# Patient Record
Sex: Female | Born: 1960 | Race: White | Hispanic: No | Marital: Married | Smoking: Former smoker
Health system: Southern US, Community
[De-identification: ages and names within clinical notes are randomized; demographics above are authoritative.]

## PROBLEM LIST (undated history)

## (undated) DIAGNOSIS — G5600 Carpal tunnel syndrome, unspecified upper limb: Secondary | ICD-10-CM

## (undated) DIAGNOSIS — M797 Fibromyalgia: Secondary | ICD-10-CM

## (undated) DIAGNOSIS — I719 Aortic aneurysm of unspecified site, without rupture: Secondary | ICD-10-CM

## (undated) DIAGNOSIS — I82409 Acute embolism and thrombosis of unspecified deep veins of unspecified lower extremity: Secondary | ICD-10-CM

## (undated) DIAGNOSIS — M2550 Pain in unspecified joint: Secondary | ICD-10-CM

## (undated) DIAGNOSIS — I10 Essential (primary) hypertension: Secondary | ICD-10-CM

## (undated) DIAGNOSIS — R002 Palpitations: Secondary | ICD-10-CM

## (undated) HISTORY — DX: Carpal tunnel syndrome, unspecified upper limb: G56.00

## (undated) HISTORY — DX: Pain in unspecified joint: M25.50

## (undated) HISTORY — DX: Palpitations: R00.2

## (undated) HISTORY — DX: Acute embolism and thrombosis of unspecified deep veins of unspecified lower extremity: I82.409

## (undated) HISTORY — DX: Fibromyalgia: M79.7

## (undated) HISTORY — DX: Essential (primary) hypertension: I10

---

## 2013-05-17 ENCOUNTER — Ambulatory Visit (INDEPENDENT_AMBULATORY_CARE_PROVIDER_SITE_OTHER): Payer: BC Managed Care – PPO | Admitting: Neurology

## 2013-05-17 ENCOUNTER — Ambulatory Visit (INDEPENDENT_AMBULATORY_CARE_PROVIDER_SITE_OTHER): Payer: Self-pay | Admitting: Radiology

## 2013-05-17 DIAGNOSIS — G5600 Carpal tunnel syndrome, unspecified upper limb: Secondary | ICD-10-CM

## 2013-05-17 DIAGNOSIS — G5602 Carpal tunnel syndrome, left upper limb: Secondary | ICD-10-CM

## 2013-05-17 DIAGNOSIS — Z0289 Encounter for other administrative examinations: Secondary | ICD-10-CM

## 2013-05-17 NOTE — Procedures (Signed)
     HISTORY:  Carla RiegerJill Lehenbauer is a 53 year old patient with a history of onset of numbness and discomfort into the left hand that began in November 2014. The patient has some discomfort into the left elbow is well, and she does have some neck discomfort. The patient denies pain radiating from the neck down arm. The patient is being evaluated for possible neuropathy or a cervical radiculopathy.  NERVE CONDUCTION STUDIES:  Nerve conduction studies were performed on the left upper extremity. The distal motor latency for the left median nerve was prolonged, with a normal motor amplitude. The distal motor latency and motor activity for the left ulnar nerve was normal. The F wave latencies and nerve conduction velocities for the left median and ulnar nerves were normal. The sensory latency for the left median nerve was prolonged, normal for the left ulnar nerve and left radial nerve.   EMG STUDIES:  EMG study was performed on the left upper extremity:  The first dorsal interosseous muscle reveals 2 to 4 K units with full recruitment. No fibrillations or positive waves were noted. The abductor pollicis brevis muscle reveals 2 to 4 K units with full recruitment. No fibrillations or positive waves were noted. The extensor indicis proprius muscle reveals 1 to 3 K units with full recruitment. No fibrillations or positive waves were noted. The pronator teres muscle reveals 2 to 3 K units with full recruitment. No fibrillations or positive waves were noted. The biceps muscle reveals 1 to 2 K units with full recruitment. No fibrillations or positive waves were noted. The triceps muscle reveals 2 to 5 K units with full recruitment. No fibrillations or positive waves were noted. The anterior deltoid muscle reveals 2 to 3 K units with full recruitment. No fibrillations or positive waves were noted. The cervical paraspinal muscles were tested at 2 levels. No abnormalities of insertional activity were seen at  either level tested. There was good relaxation.   IMPRESSION:  Nerve conduction studies performed on the left upper extremity shows evidence of a mild left carpal tunnel syndrome. EMG evaluation of the left upper extremity was relatively unremarkable, without clear evidence of an overlying cervical radiculopathy.  Marlan Palau. Keith Willis MD 05/17/2013 11:02 AM  [facility] [address] [place], [place] [id number]  Phone [phone] Fax [phone]

## 2013-06-17 ENCOUNTER — Encounter: Payer: Self-pay | Admitting: *Deleted

## 2013-06-17 ENCOUNTER — Ambulatory Visit (INDEPENDENT_AMBULATORY_CARE_PROVIDER_SITE_OTHER): Payer: BC Managed Care – PPO | Admitting: Cardiovascular Disease

## 2013-06-17 ENCOUNTER — Other Ambulatory Visit: Payer: Self-pay | Admitting: *Deleted

## 2013-06-17 ENCOUNTER — Telehealth: Payer: Self-pay | Admitting: *Deleted

## 2013-06-17 VITALS — BP 142/82 | HR 76 | Ht 63.5 in | Wt 125.0 lb

## 2013-06-17 DIAGNOSIS — I10 Essential (primary) hypertension: Secondary | ICD-10-CM

## 2013-06-17 DIAGNOSIS — R002 Palpitations: Secondary | ICD-10-CM

## 2013-06-17 DIAGNOSIS — I82409 Acute embolism and thrombosis of unspecified deep veins of unspecified lower extremity: Secondary | ICD-10-CM | POA: Insufficient documentation

## 2013-06-17 DIAGNOSIS — M797 Fibromyalgia: Secondary | ICD-10-CM | POA: Insufficient documentation

## 2013-06-17 DIAGNOSIS — M2550 Pain in unspecified joint: Secondary | ICD-10-CM | POA: Insufficient documentation

## 2013-06-17 DIAGNOSIS — G5600 Carpal tunnel syndrome, unspecified upper limb: Secondary | ICD-10-CM | POA: Insufficient documentation

## 2013-06-17 NOTE — Patient Instructions (Signed)
Your physician recommends that you schedule a follow-up appointment in: AFTER TESTS ARE  DONE  WITH  [facility] Your physician recommends that you continue on your current medications as directed. Please refer to the Current Medication list given to you today. Your physician recommends that you return for lab work in: TODAY     24 HOUR  URINE    RANDOM  CORTISOL Your physician has requested that you have a stress echocardiogram. For further information please visit [email or web address]. Please follow instruction sheet as given. Your physician has requested that you have a renal artery duplex. During this test, an ultrasound is used to evaluate blood flow to the kidneys. Allow one hour for this exam. Do not eat after midnight the day before and avoid carbonated beverages. Take your medications as you usually do.

## 2013-06-17 NOTE — Assessment & Plan Note (Signed)
Resolved In general gastrocnemious clots do not represent DVT and do not need anticoagulation  No pain or swelling

## 2013-06-17 NOTE — Assessment & Plan Note (Signed)
Chest pain and palpitations with SEM f/u stress echo

## 2013-06-17 NOTE — Telephone Encounter (Signed)
LEFT  MESSAGE FOR  PT  TO CALL BACK  RE  DR  Eden EmmsNISHAN  THOUGHT   PT  WAS  TAKING   A  THIRD  MED  (ARB ) .Zack Seal/CY

## 2013-06-17 NOTE — Assessment & Plan Note (Signed)
Med list needs to be updated Continue ARB and beta blocker.  Will check cortisol, 24 hour catacholemines/metanephrines, renal US to r/o RAS  Already know K and Cr are normal More likely essential hypertension

## 2013-06-17 NOTE — Progress Notes (Signed)
Patient ID: Carla RiegerJill [name], female   DOB: 09/25/1960, 53 y.o.   MRN: [id number]  53 yo referred by primary for palpitations and HTN.  Patient concerned about secondary causes of HTN  Also having some discomfort in left hand  Recent EMG by Dr Carla [name] suggests mild carpal tunnel  She moved from [place] 6 months ago.  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  Has had issues with BP over last 3 years. Cough with ACE On escalating doses of ARB.  Recent tightness in chest and palpitations while running.  No syncope.  She is active does yoga , no excess salt and no excess ETOH   She does have some anxiety over BP and the move to [place] has been stressful.  Has an 54 yo daughter as well.  No history of chronic kidney disease  No heart disease [facility] Cr and K.  She did have a left gastrocnemous DVT Rx for 3 months with xarelto  This was at a time she was flying and traveling a lot for work      ROS: Denies fever, malais, weight loss, blurry vision, decreased visual acuity, cough, sputum, SOB, hemoptysis, pleuritic pain, palpitaitons, heartburn, abdominal pain, melena, lower extremity edema, claudication, or rash.  All other systems reviewed and negative   General: Affect appropriate Healthy:  appears stated age HEENT: normal Neck supple with no adenopathy JVP normal no bruits no thyromegaly Lungs clear with no wheezing and good diaphragmatic motion Heart:  S1/S2 1/6 SEM  murmur,rub, gallop or click PMI normal Abdomen: benighn, BS positve, no tenderness, no AAA no bruit.  No HSM or HJR Distal pulses intact with no bruits No edema Neuro non-focal Skin warm and dry No muscular weakness  Medications No current outpatient prescriptions on file.   No current facility-administered medications for this visit.    Allergies Review of patient's allergies indicates not on file.  Family History: Family History  Problem Relation Age of Onset  . Aneurysm Mother     brain    Social  History: History   Social History  . Marital Status: Unknown    Spouse Name: N/A    Number of Children: N/A  . Years of Education: N/A   Occupational History  . Not on file.   Social History Main Topics  . Smoking status: Former Games developermoker  . Smokeless tobacco: Not on file     Comment: quit 2003-2004  . Alcohol Use: Yes  . Drug Use: No  . Sexual Activity: Not on file   Other Topics Concern  . Not on file   Social History Narrative  . No narrative on file    Electrocardiogram: SR rate 61 normal ECG no LVH   Assessment and Plan

## 2013-06-18 LAB — CORTISOL: CORTISOL PLASMA: 11.8 ug/dL

## 2013-06-18 NOTE — Telephone Encounter (Signed)
New Prob    Pt has some information to provide for her chart. Please call.

## 2013-06-18 NOTE — Telephone Encounter (Signed)
Pt asks for result of blood work done ysterday.

## 2013-06-18 NOTE — Telephone Encounter (Signed)
Cortisol level is normal

## 2013-06-18 NOTE — Telephone Encounter (Signed)
Patient called to say she is taking benicar 40 mg daily.  Added this to her medication list.  She would like results of her testing yesterday to be released to My Chart when available.

## 2013-06-18 NOTE — Telephone Encounter (Signed)
Follow UP:  Pt is calling for test results.

## 2013-06-18 NOTE — Telephone Encounter (Signed)
Pt made aware cortisol level is normal.

## 2013-06-21 ENCOUNTER — Other Ambulatory Visit: Payer: BC Managed Care – PPO

## 2013-06-21 DIAGNOSIS — R002 Palpitations: Secondary | ICD-10-CM

## 2013-06-21 DIAGNOSIS — I10 Essential (primary) hypertension: Secondary | ICD-10-CM

## 2013-06-25 ENCOUNTER — Ambulatory Visit (HOSPITAL_COMMUNITY): Payer: BC Managed Care – PPO | Attending: Cardiovascular Disease

## 2013-06-25 DIAGNOSIS — I10 Essential (primary) hypertension: Secondary | ICD-10-CM | POA: Insufficient documentation

## 2013-06-25 LAB — CATECHOLAMINES, FRACTIONATED, URINE, 24 HOUR
Calculated Total (E+NE): 58 mcg/24 h (ref 26–121)
Creatinine, Urine mg/day-CATEUR: 0.97 g/(24.h) (ref 0.63–2.50)
Dopamine, 24 hr Urine: 200 mcg/24 h (ref 52–480)
EPINEPHRINE, 24 HR URINE: 8 ug/(24.h) (ref 2–24)
Norepinephrine, 24 hr Ur: 50 mcg/24 h (ref 15–100)
TOTAL VOLUME - CF 24HR U: 1500 mL

## 2013-06-26 ENCOUNTER — Encounter: Payer: Self-pay | Admitting: Cardiovascular Disease

## 2013-06-26 LAB — METANEPHRINES, URINE, 24 HOUR
Metaneph Total, Ur: 316 mcg/24 h (ref 224–832)
Metanephrines, Ur: 78 mcg/24 h — ABNORMAL LOW (ref 90–315)
Normetanephrine, 24H Ur: 238 mcg/24 h (ref 122–676)

## 2013-06-27 NOTE — Telephone Encounter (Signed)
Carla [name] called because she was confused about her lab results. She is aware that the lab results are normal but was concerned because of the information listed below the results which stated that results 4 times normal were most likely due to a tumor. She was very concerned about this.  Spent greater than 30 minutes in conversation with her discussing the results and reassuring her that the tumor was only likely if results were 4 times normal. Since her results were normal, I did not feel a tumor was likely.  She felt this information and should not be included in the results because it might confuse people

## 2013-07-15 ENCOUNTER — Encounter: Payer: Self-pay | Admitting: Cardiovascular Disease

## 2013-07-15 ENCOUNTER — Ambulatory Visit (HOSPITAL_COMMUNITY): Payer: BC Managed Care – PPO | Attending: Cardiovascular Disease | Admitting: Radiology

## 2013-07-15 DIAGNOSIS — I10 Essential (primary) hypertension: Secondary | ICD-10-CM | POA: Insufficient documentation

## 2013-07-15 DIAGNOSIS — R072 Precordial pain: Secondary | ICD-10-CM

## 2013-07-15 DIAGNOSIS — R002 Palpitations: Secondary | ICD-10-CM | POA: Insufficient documentation

## 2013-07-15 NOTE — Progress Notes (Signed)
Stress Echocardiogram performed.  

## 2013-07-16 ENCOUNTER — Ambulatory Visit (INDEPENDENT_AMBULATORY_CARE_PROVIDER_SITE_OTHER): Payer: BC Managed Care – PPO | Admitting: Cardiovascular Disease

## 2013-07-16 ENCOUNTER — Encounter: Payer: Self-pay | Admitting: Cardiovascular Disease

## 2013-07-16 VITALS — BP 156/86 | HR 50 | Ht 63.5 in | Wt 129.0 lb

## 2013-07-16 DIAGNOSIS — I10 Essential (primary) hypertension: Secondary | ICD-10-CM

## 2013-07-16 MED ORDER — LISINOPRIL-HYDROCHLOROTHIAZIDE 20-25 MG PO TABS
1.0000 | ORAL_TABLET | Freq: Every day | ORAL | Status: DC
Start: 2013-07-16 — End: 2013-07-19

## 2013-07-16 MED ORDER — NEBIVOLOL HCL 2.5 MG PO TABS: 2.5000 mg | ORAL_TABLET | Freq: Every day | ORAL | Status: DC

## 2013-07-16 NOTE — Assessment & Plan Note (Signed)
Will call in generic lisinopril/HCTZ 20/25 mg  She indicates wanting to stop beta blocker.  She will restart her beta blocker if palpitatoins get worse

## 2013-07-16 NOTE — Patient Instructions (Signed)
Your physician recommends that you schedule a follow-up appointment in: AS NEEDED Your physician recommends that you continue on your current medications as directed. Please refer to the Current Medication list given to you today. LISINOPRIL /HCTZ  20/25 MG  EVERY DAY

## 2013-07-16 NOTE — Progress Notes (Signed)
Patient ID: Carla RiegerJill Salm, female   DOB: [birth year], 53 y.o.   MRN: [id number] 53 yo referred by primary for palpitations and HTN. Patient concerned about secondary causes of HTN Also having some discomfort in left hand Recent EMG by Dr Hanah HahnWillis suggests mild carpal tunnel She moved from [place] 6 months ago. 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 Cough with ACE  On escalating doses of ARB. Recent tightness in chest and palpitations while running. No syncope. She is active does yoga , no excess salt and no excess ETOH  She does have some anxiety over BP and the move to [place] has been stressful. Has an 54 yo daughter as well. No history of chronic kidney disease No heart disease  [facility] normal Cr and K. She did have a left gastrocnemous DVT Rx for 3 months with xarelto This was at a time she was flying and traveling a lot for work    F/U stress echo normal F/U renal duplex normal  F/U corisol, metanephrines and K/Cr all normal as well   Continues to be somewhat anxious about her fluctuating BP  Was supposed to be on benicar but too expensive.  Beta blockers have helped her palpitations But does not want to be on 3 medications   ROS: Denies fever, malais, weight loss, blurry vision, decreased visual acuity, cough, sputum, SOB, hemoptysis, pleuritic pain, palpitaitons, heartburn, abdominal pain, melena, lower extremity edema, claudication, or rash.  All other systems reviewed and negative  General: Affect appropriate Healthy:  appears stated age HEENT: normal Neck supple with no adenopathy JVP normal no bruits no thyromegaly Lungs clear with no wheezing and good diaphragmatic motion Heart:  S1/S2 no murmur, no rub, gallop or click PMI normal Abdomen: benighn, BS positve, no tenderness, no AAA no bruit.  No HSM or HJR Distal pulses intact with no bruits No edema Neuro non-focal Skin warm and dry No muscular weakness   Current Outpatient  Prescriptions  Medication Sig Dispense Refill  . clonazePAM (KLONOPIN) 0.5 MG tablet as needed.      . nebivolol (BYSTOLIC) 5 MG tablet Take 2.5 mg by mouth daily.      Marland Kitchen. olmesartan (BENICAR) 40 MG tablet Take 40 mg by mouth daily.       No current facility-administered medications for this visit.    Allergies  Review of patient's allergies indicates no known allergies.  Electrocardiogram:  Assessment and Plan

## 2013-07-19 ENCOUNTER — Other Ambulatory Visit: Payer: Self-pay | Admitting: *Deleted

## 2013-07-19 ENCOUNTER — Encounter: Payer: Self-pay | Admitting: Cardiovascular Disease

## 2013-07-19 MED ORDER — LOSARTAN POTASSIUM-HCTZ 100-25 MG PO TABS: 1.0000 | ORAL_TABLET | Freq: Every day | ORAL | Status: DC

## 2013-08-16 ENCOUNTER — Other Ambulatory Visit: Payer: Self-pay | Admitting: *Deleted

## 2013-08-16 ENCOUNTER — Encounter: Payer: Self-pay | Admitting: Cardiovascular Disease

## 2013-08-16 MED ORDER — ATENOLOL 25 MG PO TABS: 25.0000 mg | ORAL_TABLET | Freq: Every day | ORAL | Status: DC

## 2013-08-27 NOTE — Telephone Encounter (Signed)
CALLED  PT RE  CHANGING  MED  PT  REFUSES   TO DO  AT THIS TIME  HAS STOPPED  BYSTOLIC./CY

## 2013-10-22 ENCOUNTER — Other Ambulatory Visit: Payer: Self-pay | Admitting: Obstetrics and Gynecology

## 2013-10-22 DIAGNOSIS — Z1231 Encounter for screening mammogram for malignant neoplasm of breast: Secondary | ICD-10-CM

## 2013-11-30 ENCOUNTER — Ambulatory Visit: Payer: BC Managed Care – PPO

## 2013-12-07 ENCOUNTER — Ambulatory Visit: Payer: BC Managed Care – PPO

## 2013-12-08 ENCOUNTER — Ambulatory Visit
Admission: RE | Admit: 2013-12-08 | Discharge: 2013-12-08 | Disposition: A | Discharge status: Home / Self Care | Payer: BC Managed Care – PPO | Source: Ambulatory Visit | Attending: Obstetrics and Gynecology | Admitting: Obstetrics and Gynecology

## 2013-12-08 DIAGNOSIS — Z1231 Encounter for screening mammogram for malignant neoplasm of breast: Secondary | ICD-10-CM

## 2014-03-08 ENCOUNTER — Other Ambulatory Visit: Payer: Self-pay | Admitting: Gastroenterology

## 2014-03-08 DIAGNOSIS — R109 Unspecified abdominal pain: Secondary | ICD-10-CM

## 2014-03-14 ENCOUNTER — Ambulatory Visit
Admission: RE | Admit: 2014-03-14 | Discharge: 2014-03-14 | Disposition: A | Discharge status: Home / Self Care | Payer: BC Managed Care – PPO | Source: Ambulatory Visit | Attending: Gastroenterology | Admitting: Gastroenterology

## 2014-03-14 DIAGNOSIS — R109 Unspecified abdominal pain: Secondary | ICD-10-CM

## 2014-08-12 ENCOUNTER — Other Ambulatory Visit: Payer: Self-pay | Admitting: Family Medicine

## 2014-08-24 ENCOUNTER — Other Ambulatory Visit: Payer: Self-pay | Admitting: Family Medicine

## 2014-08-25 ENCOUNTER — Other Ambulatory Visit: Payer: Self-pay | Admitting: Family Medicine

## 2014-08-25 DIAGNOSIS — Z87891 Personal history of nicotine dependence: Secondary | ICD-10-CM

## 2014-08-31 ENCOUNTER — Other Ambulatory Visit: Payer: Self-pay | Admitting: Family Medicine

## 2014-08-31 ENCOUNTER — Other Ambulatory Visit: Payer: Self-pay

## 2014-09-01 ENCOUNTER — Ambulatory Visit
Admission: RE | Admit: 2014-09-01 | Discharge: 2014-09-01 | Disposition: A | Discharge status: Home / Self Care | Payer: Self-pay | Source: Ambulatory Visit | Attending: Family Medicine | Admitting: Family Medicine

## 2014-09-01 DIAGNOSIS — Z87891 Personal history of nicotine dependence: Secondary | ICD-10-CM

## 2014-09-05 ENCOUNTER — Other Ambulatory Visit: Payer: Self-pay | Admitting: Cardiology

## 2014-09-05 DIAGNOSIS — I10 Essential (primary) hypertension: Secondary | ICD-10-CM

## 2014-09-06 ENCOUNTER — Other Ambulatory Visit: Payer: Self-pay | Admitting: Cardiology

## 2014-09-06 ENCOUNTER — Ambulatory Visit
Admission: RE | Admit: 2014-09-06 | Discharge: 2014-09-06 | Disposition: A | Discharge status: Home / Self Care | Payer: Self-pay | Source: Ambulatory Visit | Attending: Cardiology | Admitting: Cardiology

## 2014-09-06 DIAGNOSIS — R072 Precordial pain: Secondary | ICD-10-CM

## 2014-09-06 DIAGNOSIS — I10 Essential (primary) hypertension: Secondary | ICD-10-CM

## 2014-09-06 NOTE — Progress Notes (Signed)
Carla [name], [name]  Date of visit:  09/05/2014 DOB:  1960-09-01    Age:  53 yrs. Medical record number:  [id number]     Account number:  [id number] Primary Care Provider: [facility] ____________________________ CURRENT DIAGNOSES  1. Essential hypertension  2. Ventricular premature depolarization  3. CAD Native without angina  4. Atrial premature depolarization  5. Ascending aortic aneurysm  6. Dyspnea ____________________________ ALLERGIES  Ace Inhibitors, Intolerance-cough  Bystolic, Headache ____________________________ MEDICATIONS  1. hydrochlorothiazide 25 mg tablet, 1 p.o. daily  2. Benicar 20 mg tablet, 1 p.o. daily  3. metoprolol succinate ER 25 mg tablet,extended release 24 hr, 1 p.o. daily  4. atorvastatin 10 mg tablet, 1 p.o. daily ____________________________ CHIEF COMPLAINTS  talk re ct results ____________________________ HISTORY OF PRESENT ILLNESS Patient returns early for cardiac followup. She evidently had a CT scan done at the request of her primary doctor and asked me to review the results. She was found to have a dilated ascending aorta as well as some degree of coronary calcification involving the right coronary artery and LAD and she wished to discuss this further. She previously had an echocardiogram that showed mild LVH and in addition her treadmill was negative into Bruce stage V. She continues to complain of inability to hold out doing very long runs blood has been able to be involved in normal physical activity. In the past she has worn an  event monitor that has shown that her heart rate is up after she runs long distances but nothing pathological. No definite angina that I can tell. She had her cholesterol checked earlier today. She does have a family history of aortic aneurysm with both her mother and her brother having had surgical repair. I did review the actual CT images as well as the report. ____________________________ PAST HISTORY  Past Medical  Illnesses:  hypertension, DVT of gastocnemius veins 2014, allergic rhiniits, carpal tunnel syndrome;  Cardiovascular Illnesses:  arrhythmia-PVCs;  Surgical Procedures:  appendectomy, cholecystectomy, tonsillectomy;  NYHA Classification:  I;  Canadian Angina Classification:  Class 0: Asymptomatic;  Cardiology Procedures-Invasive:  no history of prior cardiac procedures;  Cardiology Procedures-Noninvasive:  stress echocardiogram March 2015, event monitor May 2015, event monitor March 2016;  Peripheral Vascular Procedures:  renal doppler;  LVEF of 60% documented via echocardiogram on 07/18/2013,   ____________________________ CARDIO-PULMONARY TEST DATES EKG Date:  08/04/2014;  Holter/Event Monitor Date: 08/05/2014;  Echocardiography Date: 07/18/2013;   ____________________________ FAMILY HISTORY Brother -- Brother alive and well Brother -- Brother alive with problem, Aortic aneurysm Father -- Father dead, Malignant neoplasm of lung Mother -- Mother dead, Cerebral arterial aneurysm Sister -- Sister alive and well Sister -- Sister alive and well ____________________________ SOCIAL HISTORY Alcohol Use:  6-8 glasses wine weekly;  Smoking:  used to smoke but quit, 15 pack year history;  Diet:  vegetarian;  Lifestyle:  married;  Occupation:  [employer];  Residence:  lives with husband and children;   ____________________________ REVIEW OF SYSTEMS General:  denies recent weight change, fatique or change in exercise tolerance. Eyes: wears eye glasses/contact lenses Respiratory: denies dyspnea, cough, wheezing or hemoptysis. Cardiovascular:  please review HPI Abdominal: denies dyspepsia, GI bleeding, constipation, or diarrheaGenitourinary-Female: frequency, urgency Musculoskeletal:  cervical disc disease, arthritis of the neck Neurological:  headaches Psychiatric:  situational stress, insomnia  ____________________________ PHYSICAL EXAMINATION VITAL SIGNS  Blood Pressure:  120/70 Sitting, Left arm,  regular cuff  , 116/70 Standing, Left arm and regular cuff   Pulse:  94/min. Weight:  131.00 lbs. Height:  63"BMI: 23  Constitutional:  pleasant white female, in no acute distress, anxious Skin:  warm and dry to touch, no apparent skin lesions, or masses noted. Head:  normocephalic, normal hair pattern, no masses or tenderness Chest:  normal symmetry, clear to auscultation. Cardiac:  regular rhythm, normal S1 and S2, No S3 or S4, no murmurs, gallops or rubs detected. Neurological:  no gross motor or sensory deficits noted, affect appropriate, oriented x3. ____________________________ IMPRESSIONS/PLAN 1. Mild thoracic aortic aneurysm 2. Coronary calcification is noted on CT scan without obvious symptoms and negative treadmill into Bruce stage V 3. Essential hypertension  Recommendations:  1. Discussed management of thoracic aneurysms. At her current size she will need yearly surveillance with a CT scan. We discussed limitation of severe weight lifting and also recommended that she initiate beta blocker therapy with metoprolol XL 25 mg daily. She does have a family history so we will need to watch this 2. Coronary calcification as a manifestation of coronary artery disease was also discussed with the patient. Her lipid status is pending. In light of her aneurysm as well as her coronary calcification of recommended statin therapy her regardless of her values. She would best benefit from high-intensity statin therapy but she is somewhat hesitant to take statins and we discussed this in detail. She was willing to initiate atorvastatin 10 mg every other day 3. Hypertension 4. Episodic palpitations that we've not been able to identify a cause for on monitoring.  She was very interested known how much calcium she has and I recommended a coronary calcium score to quantitate with  followup afterwards. Thank you for asking me to see her with you.  SHe later called back and wished to have a coronary CTA  performed.  ____________________________ TODAYS ORDERS  1. Cardiac calcium score or coronary CTA  2. Return Visit: 3 months                       ____________________________ Cardiology Physician:  Darden Palmer MD [facility]

## 2014-09-12 ENCOUNTER — Other Ambulatory Visit: Payer: Self-pay | Admitting: Cardiology

## 2014-09-12 ENCOUNTER — Encounter: Payer: Self-pay | Admitting: Cardiology

## 2014-09-12 DIAGNOSIS — E785 Hyperlipidemia, unspecified: Secondary | ICD-10-CM

## 2014-09-12 DIAGNOSIS — I2510 Atherosclerotic heart disease of native coronary artery without angina pectoris: Secondary | ICD-10-CM

## 2014-09-12 DIAGNOSIS — E7849 Other hyperlipidemia: Secondary | ICD-10-CM | POA: Insufficient documentation

## 2014-09-12 NOTE — Progress Notes (Unsigned)
Carla [name], [name]  Date of visit:  09/12/2014 DOB:  10-25-1960    Age:  53 yrs. Medical record number:  [id number]     Account number:  [id number] Primary Care Provider: MCNEILL, [facility] ____________________________ CURRENT DIAGNOSES  1. CAD Native without angina  2. Ventricular premature depolarization  3. Encounter for preprocedural cardiovascular examination  4. Essential hypertension  5. Ascending aortic aneurysm  6. Dyspnea  7. Atrial premature depolarization ____________________________ ALLERGIES  Ace Inhibitors, Intolerance-cough  Bystolic, Headache ____________________________ MEDICATIONS  1. hydrochlorothiazide 25 mg tablet, 1 p.o. daily  2. Benicar 20 mg tablet, 1 p.o. daily  3. atorvastatin 40 mg tablet, 1 p.o. daily ____________________________ CHIEF COMPLAINTS  to discuss cath ____________________________ HISTORY OF PRESENT ILLNESS  This [age] year old female is seen for a precatheterization cardiac evaluation. The patient has a history of hypertension for the past 4 years and was previously treated in [place] the with an ACE inhibitor then eventually caused a cough. She was later switched to olmesartan then helped her blood pressure and also helped reduce her PVCs. 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 Her blood pressure has been elevated since she moved and she has had some situational stress related to relocation as well as dealing with her 55 year old son who still is in [place] and has difficulty with substance abuse. She began that note uncontrolled high blood pressure and also developed frequent PVCs at rest and while running. She was placed on diastolic and later was seen by Dr. Charlton HawsPeter Nishan and had a renal Doppler that showed no evidence of renal artery stenosis, a stress echocardiogram that was normal and -24 hour urine for catecholamines and a normal cortisol. She discontinued taking Bystolic because of a  headache and has been taking losartan/HCTZ for blood pressure.She had a DVT of her gastrocnemius muscle treated with Xarelto for 3 months and evidently had a negative followup Doppler study. At the time I saw her one year ago she had blood pressure that was not well-controlled as well as PVCs. We discussed the PVC and recommended discontinuation of alcohol and avoids caffeine or over-the-counter decongestants. It was recommended that we double her dose of losartan HCTZ and at that point in time she also had an exercise treadmill test that showed no PVCs and normal blood pressure response and no ST segment depression consistent with ischemia. Time she walked for 10 minutes and 25 seconds.  She declined taking beta blockers for PVCs at that time. She wore an event monitor showing sinus tachycardia with rates in the 150s and 160s with exercise. She added a low-dose of hydrochlorothiazide and noted that her blood pressure was more normotensive. I do not see her in followup until March when she came in for eval a sustained rapid heartbeat with exercise. She has a fit bed and noticed that her pulse maintain a high level when she is hiking will become dyspneic. She feels as if she cannot run his much and she did previously and may have dyspnea with exertion. She is concerned because she feels as if her heart rate should not go up fast and she senses significant palpitations and a rapid heartbeat a level when she should not be feeling this. She previously stopped taking Benicar and increased her HCTZ to 25 mg daily. She had another exercise treadmill at which time she went 13 minutes and 30 seconds heart rate rising to 166 with normal blood  pressure response. Another cardiac event monitor showed an episode of sinus tachycardia with exercise at a maximum rate of 160. Because of continued dyspnea she later had a CT scan ordered by her primary MD that showed a dilated aorta and some calcium in her arteries. She became  anxious about this and then had a calcium score which returned elevated at 227. She has been concerned over her ability to exercise and whether or not she has coronary artery disease. We attempted to get a CTA on her but the insurance company would not authorize it and she continues to complain of symptomatic discomfort with high levels of exercise and is concerned about her ability to exercise safely in light of her coronary calcium. After considerable discussion with her and her husband, she very much wanted to undergo a cardiac catheterization and deal with any stenosis if it were present. She specifically very much wanted to go ahead to cardiac catheterization. ____________________________ PAST HISTORY  Past Medical Illnesses:  hypertension, DVT of gastocnemius veins 2014, allergic rhiniits, carpal tunnel syndrome;  Cardiovascular Illnesses:  arrhythmia-PVCs;  Surgical Procedures:  appendectomy, cholecystectomy, tonsillectomy;  NYHA Classification:  I;  Canadian Angina Classification:  Class 0: Asymptomatic;  Cardiology Procedures-Invasive:  no history of prior cardiac procedures;  Cardiology Procedures-Noninvasive:  stress echocardiogram March 2015, event monitor May 2015, event monitor March 2016, calcium score;  Peripheral Vascular Procedures:  renal doppler;  LVEF of 60% documented via echocardiogram on 07/18/2013,   ____________________________ CARDIO-PULMONARY TEST DATES EKG Date:  08/04/2014;  Holter/Event Monitor Date: 08/05/2014;  Echocardiography Date: 07/18/2013;   ____________________________ FAMILY HISTORY Brother -- Brother alive and well Brother -- Brother alive with problem, Aortic aneurysm Father -- Father dead, Malignant neoplasm of lung Mother -- Mother dead, Cerebral arterial aneurysm Sister -- Sister alive and well Sister -- Sister alive and well ____________________________ SOCIAL HISTORY Alcohol Use:  6-8 glasses wine weekly;  Smoking:  used to smoke but quit, 15 pack year  history;  Diet:  vegetarian;  Lifestyle:  married;  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  Integumentary:no rashes or new skin lesions. Eyes: wears eye glasses/contact lenses Respiratory: denies dyspnea, cough, wheezing or hemoptysis. Cardiovascular:  please review HPI Abdominal: denies dyspepsia, GI bleeding, constipation, or diarrheaGenitourinary-Female: frequency, urgency Musculoskeletal:  cervical disc disease, arthritis of the neck Neurological:  headaches Psychiatric:  situational stress, insomnia  ____________________________ PHYSICAL EXAMINATION VITAL SIGNS  Blood Pressure:  134/70 Sitting, Right arm, regular cuff  , 128/72 Standing, Right arm and regular cuff   Pulse:  68/min. Weight:  130.00 lbs. Height:  63"BMI: 23  Constitutional:  pleasant white female, in no acute distress, anxious Skin:  warm and dry to touch, no apparent skin lesions, or masses noted. Head:  normocephalic, normal hair pattern, no masses or tenderness Eyes:  EOMS Intact, PERRLA, C and S clear, Funduscopic exam not done. ENT:  ears, nose and throat reveal no gross abnormalities.  Dentition good. Neck:  supple, without massess. No JVD, thyromegaly or carotid bruits. Carotid upstroke normal. Chest:  normal symmetry, clear to auscultation. Cardiac:  regular rhythm, normal S1 and S2, No S3 or S4, no murmurs, gallops or rubs detected. Abdomen:  abdomen soft,non-tender, no masses, no hepatospenomegaly, or aneurysm noted Peripheral Pulses:  the femoral,dorsalis pedis, and posterior tibial pulses are full and equal bilaterally with no bruits auscultated. Extremities & Back:  no edema present Neurological:  no gross motor or sensory deficits noted, affect appropriate, oriented x3. ____________________________ MOST RECENT LIPID PANEL 09/05/14  CHOL TOTL  227 mg/dl, LDL 086128 NM, HDL 72 mg/dl, TRIGLYCER 578137 mg/dl and CHOL/HDL 3.2 (Calc) ____________________________ IMPRESSIONS/PLAN  1. Coronary artery disease as manifested by a high coronary calcium score and 54 year old woman 2. Hypertension under treatment 3. Remote history of tobacco abuse 4. Anxiety  Recommendations:  Long extensive discussion regarding workup of her disease as well as risks and disease process. She very much wants a cardiac catheterization to assess the extent of her coronary atherosclerosis in her inability to exercise like she previously had. We discussed that she may not have flow-limiting stenosis and also that stenting was not indicated for stenoses are not flow limiting. We also discussed the possibility that this could be due to some microvascular disease also.  Cardiac catheterization was discussed with the patient including risks of myocardial infarction, death, stroke, bleeding, arrhythmia, dye allergy, or renal insufficiency. She understands and is willing to proceed. Possibility of percutaneous intervention at the same setting was also discussed with the patient including risks. We also discussed that the procedure could be done through the radial approach and that prior to any intervention likely would need to have FFR to determine if it is flow-limiting.  She has been recommended to take a high intensity statin as well as a beta blocker.  ____________________________ TODAYS ORDERS  1. Left Heart Cath/Poss PCI: First Available  2. Comprehensive Metabolic Panel: Today  3. Complete Blood Count: Today  4. Draw PT/INR: today  5. PTT: Today                       ____________________________ Cardiology Physician:  Darden PalmerW. Spencer Arianna Haydon, Jr. MD [facility]

## 2014-09-13 ENCOUNTER — Telehealth: Payer: Self-pay | Admitting: Cardiovascular Disease

## 2014-09-13 NOTE — Telephone Encounter (Signed)
Called pt to schedule cath per Dr. York Spanielilley's request. Pt is scheduled for 5/12 @ 9am with Dr Clifton JamesMcAlhany. Pt was instructed to be NPO after midnight [facility]. Pt understand these instructions and states she will be here on Thursday.

## 2014-09-14 NOTE — Progress Notes (Signed)
Patient ID: Carla [name], female   DOB: 10/02/1960, 53 y.o.   MRN: [id number]   Carla [name]  Date of visit:  09/12/2014 DOB:  11/28/1960    Age:  53 yrs. Medical record number:  [id number]     Account number:  [id number] Primary Care Provider: MCNEILL, WENDY ____________________________ CURRENT DIAGNOSES  1. CAD Native without angina  2. Ventricular premature depolarization  3. Encounter for preprocedural cardiovascular examination  4. Essential hypertension  5. Ascending aortic aneurysm  6. Dyspnea  7. Atrial premature depolarization ____________________________ ALLERGIES  Ace Inhibitors, Intolerance-cough  Bystolic, Headache ____________________________ MEDICATIONS  1. hydrochlorothiazide 25 mg tablet, 1 p.o. daily  2. Benicar 20 mg tablet, 1 p.o. daily  3. atorvastatin 40 mg tablet, 1 p.o. daily ____________________________ CHIEF COMPLAINTS  to discuss cath ____________________________ HISTORY OF PRESENT ILLNESS  This 53-year-old female is seen for [name] precatheterization cardiac evaluation. The patient has [name] history of hypertension for the past 4 years and was previously treated in [place] the with an ACE inhibitor then eventually caused [name] cough. She was later switched to olmesartan then helped her blood pressure and also helped reduce her PVCs. 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 Her blood pressure has been elevated since she moved and she has had some situational stress related to relocation as well as dealing with her 23-year-old son who still is in [place] and has difficulty with substance abuse. She began that note uncontrolled high blood pressure and also developed frequent PVCs at rest and while running. She was placed on diastolic and later was seen by Dr. Peter Nishan and had [name] renal Doppler that showed no evidence of renal artery stenosis, [name] stress echocardiogram that was normal and -24 hour urine for  catecholamines and [name] normal cortisol. She discontinued taking Bystolic because of [name] headache and has been taking losartan/HCTZ for blood pressure.She had [name] DVT of her gastrocnemius muscle treated with Xarelto for 3 months and evidently had [name] negative followup Doppler study. At the time I saw her one year ago she had blood pressure that was not well-controlled as well as PVCs. We discussed the PVC and recommended discontinuation of alcohol and avoids caffeine or over-the-counter decongestants. It was recommended that we double her dose of losartan HCTZ and at that point in time she also had an exercise treadmill test that showed no PVCs and normal blood pressure response and no ST segment depression consistent with ischemia. Time she walked for 10 minutes and 25 seconds.  She declined taking beta blockers for PVCs at that time. She wore an event monitor showing sinus tachycardia with rates in the 150s and 160s with exercise. She added [name] low-dose of hydrochlorothiazide and noted that her blood pressure was more normotensive. I do not see her in followup until March when she came in for eval [name] sustained rapid heartbeat with exercise. She has [name] fit bed and noticed that her pulse maintain [name] high level when she is hiking will become dyspneic. She feels as if she cannot run his much and she did previously and may have dyspnea with exertion. She is concerned because she feels as if her heart rate should not go up fast and she senses significant palpitations and [name] rapid heartbeat [name] level when she should not be feeling this. She previously stopped taking Benicar and increased her HCTZ to 25 mg daily. She had another exercise treadmill at which   time she went 13 minutes and 30 seconds heart rate rising to 166 with normal blood pressure response. Another cardiac event monitor showed an episode of sinus tachycardia with exercise at [name] maximum rate of 160. Because of continued dyspnea she later had [name] CT scan ordered by her  primary MD that showed [name] dilated aorta and some calcium in her arteries. She became anxious about this and then had [name] calcium score which returned elevated at 227. She has been concerned over her ability to exercise and whether or not she has coronary artery disease. We attempted to get [name] CTA on her but the insurance company would not authorize it and she continues to complain of symptomatic discomfort with high levels of exercise and is concerned about her ability to exercise safely in light of her coronary calcium. After considerable discussion with her and her husband, she very much wanted to undergo [name] cardiac catheterization and deal with any stenosis if it were present. She specifically very much wanted to go ahead to cardiac catheterization. ____________________________ PAST HISTORY  Past Medical Illnesses:  hypertension, DVT of gastocnemius veins 2014, allergic rhiniits, carpal tunnel syndrome;  Cardiovascular Illnesses:  arrhythmia-PVCs;  Surgical Procedures:  appendectomy, cholecystectomy, tonsillectomy;  NYHA Classification:  I;  Canadian Angina Classification:  Class 0: Asymptomatic;  Cardiology Procedures-Invasive:  no history of prior cardiac procedures;  Cardiology Procedures-Noninvasive:  stress echocardiogram March 2015, event monitor May 2015, event monitor March 2016, calcium score;  Peripheral Vascular Procedures:  renal doppler;  LVEF of 60% documented via echocardiogram on 07/18/2013,   ____________________________ CARDIO-PULMONARY TEST DATES EKG Date:  08/04/2014;  Holter/Event Monitor Date: 08/05/2014;  Echocardiography Date: 07/18/2013;   ____________________________ FAMILY HISTORY Brother -- Brother alive and well Brother -- Brother alive with problem, Aortic aneurysm Father -- Father dead, Malignant neoplasm of lung Mother -- Mother dead, Cerebral arterial aneurysm Sister -- Sister alive and well Sister -- Sister alive and well ____________________________ SOCIAL  HISTORY Alcohol Use:  6-8 glasses wine weekly;  Smoking:  used to smoke but quit, 15 pack year history;  Diet:  vegetarian;  Lifestyle:  married;  Occupation:  professor [employer];  Residence:  lives with husband and children;   ____________________________ REVIEW OF SYSTEMS General:  denies recent weight change, fatique or change in exercise tolerance.  Integumentary:no rashes or new skin lesions. Eyes: wears eye glasses/contact lenses Respiratory: denies dyspnea, cough, wheezing or hemoptysis. Cardiovascular:  please review HPI Abdominal: denies dyspepsia, GI bleeding, constipation, or diarrheaGenitourinary-Female: frequency, urgency Musculoskeletal:  cervical disc disease, arthritis of the neck Neurological:  headaches Psychiatric:  situational stress, insomnia  ____________________________ PHYSICAL EXAMINATION VITAL SIGNS  Blood Pressure:  134/70 Sitting, Right arm, regular cuff  , 128/72 Standing, Right arm and regular cuff   Pulse:  68/min. Weight:  130.00 lbs. Height:  63"BMI: 23  Constitutional:  pleasant white female, in no acute distress, anxious Skin:  warm and dry to touch, no apparent skin lesions, or masses noted. Head:  normocephalic, normal hair pattern, no masses or tenderness Eyes:  EOMS Intact, PERRLA, C and S clear, Funduscopic exam not done. ENT:  ears, nose and throat reveal no gross abnormalities.  Dentition good. Neck:  supple, without massess. No JVD, thyromegaly or carotid bruits. Carotid upstroke normal. Chest:  normal symmetry, clear to auscultation. Cardiac:  regular rhythm, normal S1 and S2, No S3 or S4, no murmurs, gallops or rubs detected. Abdomen:  abdomen soft,non-tender, no masses, no hepatospenomegaly, or aneurysm noted Peripheral Pulses:  the femoral,dorsalis pedis,   and posterior tibial pulses are full and equal bilaterally with no bruits auscultated. Extremities & Back:  no edema present Neurological:  no gross motor or sensory deficits noted, affect  appropriate, oriented x3. ____________________________ MOST RECENT LIPID PANEL 09/05/14  CHOL TOTL 227 mg/dl, LDL 128 NM, HDL 72 mg/dl, TRIGLYCER 137 mg/dl and CHOL/HDL 3.2 (Calc) ____________________________ IMPRESSIONS/PLAN  1. Coronary artery disease as manifested by [name] high coronary calcium score and 53-year-old woman 2. Hypertension under treatment 3. Remote history of tobacco abuse 4. Anxiety  Recommendations:  Long extensive discussion regarding workup of her disease as well as risks and disease process. She very much wants [name] cardiac catheterization to assess the extent of her coronary atherosclerosis in her inability to exercise like she previously had. We discussed that she may not have flow-limiting stenosis and also that stenting was not indicated for stenoses are not flow limiting. We also discussed the possibility that this could be due to some microvascular disease also.  Cardiac catheterization was discussed with the patient including risks of myocardial infarction, death, stroke, bleeding, arrhythmia, dye allergy, or renal insufficiency. She understands and is willing to proceed. Possibility of percutaneous intervention at the same setting was also discussed with the patient including risks. We also discussed that the procedure could be done through the radial approach and that prior to any intervention likely would need to have FFR to determine if it is flow-limiting.  She has been recommended to take [name] high intensity statin as well as [name] beta blocker.  ____________________________ TODAYS ORDERS  1. Left Heart Cath/Poss PCI: First Available  2. Comprehensive Metabolic Panel: Today  3. Complete Blood Count: Today  4. Draw PT/INR: today  5. PTT: Today                       ____________________________ Cardiology Physician:  W. Spencer Tilley, Jr. MD [facility]    

## 2014-09-15 ENCOUNTER — Encounter (HOSPITAL_COMMUNITY)
Admission: RE | Disposition: A | Discharge status: Home / Self Care | Payer: No Typology Code available for payment source | Source: Ambulatory Visit | Attending: Cardiovascular Disease

## 2014-09-15 ENCOUNTER — Encounter (HOSPITAL_COMMUNITY): Payer: Self-pay | Admitting: Cardiovascular Disease

## 2014-09-15 ENCOUNTER — Ambulatory Visit (HOSPITAL_COMMUNITY)
Admission: RE | Admit: 2014-09-15 | Discharge: 2014-09-15 | Disposition: A | Discharge status: Home / Self Care | Payer: BC Managed Care – PPO | Source: Ambulatory Visit | Attending: Cardiovascular Disease | Admitting: Cardiovascular Disease

## 2014-09-15 DIAGNOSIS — I491 Atrial premature depolarization: Secondary | ICD-10-CM | POA: Diagnosis not present

## 2014-09-15 DIAGNOSIS — I712 Thoracic aortic aneurysm, without rupture: Secondary | ICD-10-CM | POA: Insufficient documentation

## 2014-09-15 DIAGNOSIS — F1010 Alcohol abuse, uncomplicated: Secondary | ICD-10-CM | POA: Insufficient documentation

## 2014-09-15 DIAGNOSIS — F419 Anxiety disorder, unspecified: Secondary | ICD-10-CM | POA: Diagnosis not present

## 2014-09-15 DIAGNOSIS — I493 Ventricular premature depolarization: Secondary | ICD-10-CM | POA: Insufficient documentation

## 2014-09-15 DIAGNOSIS — I200 Unstable angina: Secondary | ICD-10-CM | POA: Diagnosis not present

## 2014-09-15 DIAGNOSIS — Z87891 Personal history of nicotine dependence: Secondary | ICD-10-CM | POA: Insufficient documentation

## 2014-09-15 DIAGNOSIS — I2510 Atherosclerotic heart disease of native coronary artery without angina pectoris: Secondary | ICD-10-CM | POA: Diagnosis present

## 2014-09-15 DIAGNOSIS — I10 Essential (primary) hypertension: Secondary | ICD-10-CM | POA: Diagnosis not present

## 2014-09-15 HISTORY — PX: CARDIAC CATHETERIZATION: SHX172

## 2014-09-15 SURGERY — LEFT HEART CATH AND CORONARY ANGIOGRAPHY
Anesthesia: LOCAL

## 2014-09-15 MED ORDER — SODIUM CHLORIDE 0.9 % WEIGHT BASED INFUSION
3.0000 mL/kg/h | INTRAVENOUS | Status: DC
  Administered 2014-09-15: 3 mL/kg/h via INTRAVENOUS

## 2014-09-15 MED ORDER — MIDAZOLAM HCL 2 MG/2ML IJ SOLN
INTRAMUSCULAR | Status: AC
  Filled 2014-09-15: qty 2

## 2014-09-15 MED ORDER — ASPIRIN 81 MG PO CHEW
CHEWABLE_TABLET | ORAL | Status: AC
  Filled 2014-09-15: qty 1

## 2014-09-15 MED ORDER — ASPIRIN 81 MG PO CHEW
81.0000 mg | CHEWABLE_TABLET | ORAL | Status: AC
  Administered 2014-09-15: 81 mg via ORAL

## 2014-09-15 MED ORDER — LIDOCAINE HCL (PF) 1 % IJ SOLN
INTRAMUSCULAR | Status: AC
  Filled 2014-09-15: qty 30

## 2014-09-15 MED ORDER — MIDAZOLAM HCL 2 MG/2ML IJ SOLN
INTRAMUSCULAR | Status: DC | PRN
  Administered 2014-09-15: 2 mg via INTRAVENOUS
  Administered 2014-09-15 (×2): 1 mg via INTRAVENOUS

## 2014-09-15 MED ORDER — IOHEXOL 350 MG/ML SOLN
INTRAVENOUS | Status: DC | PRN
  Administered 2014-09-15: 90 mL via INTRACARDIAC

## 2014-09-15 MED ORDER — NITROGLYCERIN 1 MG/10 ML FOR IR/CATH LAB
INTRA_ARTERIAL | Status: AC
  Filled 2014-09-15: qty 10

## 2014-09-15 MED ORDER — HEPARIN (PORCINE) IN NACL 2-0.9 UNIT/ML-% IJ SOLN
INTRAMUSCULAR | Status: AC
  Filled 2014-09-15: qty 1000

## 2014-09-15 MED ORDER — SODIUM CHLORIDE 0.9 % WEIGHT BASED INFUSION: 3.0000 mL/kg/h | INTRAVENOUS | Status: DC

## 2014-09-15 MED ORDER — SODIUM CHLORIDE 0.9 % IV SOLN: 250.0000 mL | INTRAVENOUS | Status: DC | PRN

## 2014-09-15 MED ORDER — SODIUM CHLORIDE 0.9 % WEIGHT BASED INFUSION: 1.0000 mL/kg/h | INTRAVENOUS | Status: DC

## 2014-09-15 MED ORDER — SODIUM CHLORIDE 0.9 % IJ SOLN: 3.0000 mL | INTRAMUSCULAR | Status: DC | PRN

## 2014-09-15 MED ORDER — VERAPAMIL HCL 2.5 MG/ML IV SOLN
INTRAVENOUS | Status: AC
  Filled 2014-09-15: qty 2

## 2014-09-15 MED ORDER — DIAZEPAM 5 MG PO TABS
ORAL_TABLET | ORAL | Status: AC
  Filled 2014-09-15: qty 1

## 2014-09-15 MED ORDER — FENTANYL CITRATE (PF) 100 MCG/2ML IJ SOLN
INTRAMUSCULAR | Status: AC
  Filled 2014-09-15: qty 2

## 2014-09-15 MED ORDER — DIAZEPAM 5 MG PO TABS
5.0000 mg | ORAL_TABLET | Freq: Once | ORAL | Status: AC
  Administered 2014-09-15: 5 mg via ORAL

## 2014-09-15 MED ORDER — FENTANYL CITRATE (PF) 100 MCG/2ML IJ SOLN
INTRAMUSCULAR | Status: DC | PRN
  Administered 2014-09-15: 50 ug via INTRAVENOUS
  Administered 2014-09-15: 25 ug via INTRAVENOUS

## 2014-09-15 MED ORDER — SODIUM CHLORIDE 0.9 % IJ SOLN
INTRAMUSCULAR | Status: DC | PRN
  Administered 2014-09-15: 09:00:00 via INTRA_ARTERIAL

## 2014-09-15 MED ORDER — SODIUM CHLORIDE 0.9 % IJ SOLN: 3.0000 mL | Freq: Two times a day (BID) | INTRAMUSCULAR | Status: DC

## 2014-09-15 SURGICAL SUPPLY — 15 items
CATH INFINITI 5 FR JL3.5 (CATHETERS) ×2 IMPLANT
CATH INFINITI 5FR ANG PIGTAIL (CATHETERS) ×2 IMPLANT
CATH INFINITI 5FR MULTPACK ANG (CATHETERS) IMPLANT
CATH INFINITI JR4 5F (CATHETERS) ×2 IMPLANT
DEVICE RAD COMP TR BAND LRG (VASCULAR PRODUCTS) ×2 IMPLANT
GLIDESHEATH SLEND SS 6F .021 (SHEATH) ×2 IMPLANT
KIT HEART LEFT (KITS) ×2 IMPLANT
PACK CARDIAC CATHETERIZATION (CUSTOM PROCEDURE TRAY) ×2 IMPLANT
SHEATH PINNACLE 5F 10CM (SHEATH) IMPLANT
SYR MEDRAD MARK V 150ML (SYRINGE) ×2 IMPLANT
TRANSDUCER W/STOPCOCK (MISCELLANEOUS) ×2 IMPLANT
TUBING CIL FLEX 10 FLL-RA (TUBING) ×2 IMPLANT
WIRE EMERALD 3MM-J .035X150CM (WIRE) IMPLANT
WIRE HI TORQ VERSACORE-J 145CM (WIRE) ×2 IMPLANT
WIRE SAFE-T 1.5MM-J .035X260CM (WIRE) ×2 IMPLANT

## 2014-09-15 NOTE — H&P (View-Only) (Signed)
Patient ID: Carla [name], female   DOB: Nov 13, 1960, 54 y.o.   MRN: [id number]   Carla [name], Carla [name]  Date of visit:  09/12/2014 DOB:  18-Aug-1960    Age:  54 yrs. Medical record number:  [id number]     Account number:  [id number] Primary Care Provider: MCNEILL, [facility] ____________________________ CURRENT DIAGNOSES  1. CAD Native without angina  2. Ventricular premature depolarization  3. Encounter for preprocedural cardiovascular examination  4. Essential hypertension  5. Ascending aortic aneurysm  6. Dyspnea  7. Atrial premature depolarization ____________________________ ALLERGIES  Ace Inhibitors, Intolerance-cough  Bystolic, Headache ____________________________ MEDICATIONS  1. hydrochlorothiazide 25 mg tablet, 1 p.o. daily  2. Benicar 20 mg tablet, 1 p.o. daily  3. atorvastatin 40 mg tablet, 1 p.o. daily ____________________________ CHIEF COMPLAINTS  to discuss cath ____________________________ HISTORY OF PRESENT ILLNESS  This 54 year old female is seen for [name] precatheterization cardiac evaluation. The patient has [name] history of hypertension for the past 4 years and was previously treated in [place] the with an ACE inhibitor then eventually caused [name] cough. She was later switched to olmesartan then helped her blood pressure and also helped reduce her PVCs. She has a PhD in the field of agitation and recently took [name] job and moved here to [place] last July. Her blood pressure has been elevated since she moved and she has had some situational stress related to relocation as well as dealing with her 55 year old son who still is in [place] and has difficulty with substance abuse. She began that note uncontrolled high blood pressure and also developed frequent PVCs at rest and while running. She was placed on diastolic and later was seen by Dr. Charlton Haws and had [name] renal Doppler that showed no evidence of renal artery stenosis, [name] stress echocardiogram that was normal and -24 hour urine for  catecholamines and [name] normal cortisol. She discontinued taking Bystolic because of [name] headache and has been taking losartan/HCTZ for blood pressure.She had [name] DVT of her gastrocnemius muscle treated with Xarelto for 3 months and evidently had [name] negative followup Doppler study. At the time I saw her 54 one year ago she had blood pressure that was not well-controlled as well as PVCs. We discussed the PVC and recommended discontinuation of alcohol and avoids caffeine or over-the-counter decongestants. It was recommended that we double her dose of losartan HCTZ and at that point in time she also had an exercise treadmill test that showed no PVCs and normal blood pressure response and no ST segment depression consistent with ischemia. Time she walked for 10 minutes and 25 seconds.  She declined taking beta blockers for PVCs at that time. She wore an event monitor showing sinus tachycardia with rates in the 150s and 160s with exercise. She added [name] low-dose of hydrochlorothiazide and noted that her blood pressure was more normotensive. I do not see her in followup until March when she came in for eval [name] sustained rapid heartbeat with exercise. She has [name] fit bed and noticed that her pulse maintain [name] high level when she is hiking will become dyspneic. She feels as if she cannot run his much and she did previously and may have dyspnea with exertion. She is concerned because she feels as if her heart rate should not go up fast and she senses significant palpitations and [name] rapid heartbeat [name] level when she should not be feeling this. She previously stopped taking Benicar and increased her HCTZ to 25 mg daily. She had another exercise treadmill at which  time she went 13 minutes and 30 seconds heart rate rising to 166 with normal blood pressure response. Another cardiac event monitor showed an episode of sinus tachycardia with exercise at [name] maximum rate of 160. Because of continued dyspnea she later had [name] CT scan ordered by her  primary MD that showed [name] dilated aorta and some calcium in her arteries. She became anxious about this and then had [name] calcium score which returned elevated at 227. She has been concerned over her ability to exercise and whether or not she has coronary artery disease. We attempted to get [name] CTA on her but the insurance company would not authorize it and she continues to complain of symptomatic discomfort with high levels of exercise and is concerned about her ability to exercise safely in light of her coronary calcium. After considerable discussion with her and her husband, she very much wanted to undergo [name] cardiac catheterization and deal with any stenosis if it were present. She specifically very much wanted to go ahead to cardiac catheterization. ____________________________ PAST HISTORY  Past Medical Illnesses:  hypertension, DVT of gastocnemius veins 2014, allergic rhiniits, carpal tunnel syndrome;  Cardiovascular Illnesses:  arrhythmia-PVCs;  Surgical Procedures:  appendectomy, cholecystectomy, tonsillectomy;  NYHA Classification:  I;  Canadian Angina Classification:  Class 0: Asymptomatic;  Cardiology Procedures-Invasive:  no history of prior cardiac procedures;  Cardiology Procedures-Noninvasive:  stress echocardiogram March 2015, event monitor May 2015, event monitor March 2016, calcium score;  Peripheral Vascular Procedures:  renal doppler;  LVEF of 60% documented via echocardiogram on 07/18/2013,   ____________________________ CARDIO-PULMONARY TEST DATES EKG Date:  08/04/2014;  Holter/Event Monitor Date: 08/05/2014;  Echocardiography Date: 07/18/2013;   ____________________________ FAMILY HISTORY Brother -- Brother alive and well Brother -- Brother alive with problem, Aortic aneurysm Father -- Father dead, Malignant neoplasm of lung Mother -- Mother dead, Cerebral arterial aneurysm Sister -- Sister alive and well Sister -- Sister alive and well ____________________________ SOCIAL  HISTORY Alcohol Use:  6-8 glasses wine weekly;  Smoking:  used to smoke but quit, 15 pack year history;  Diet:  vegetarian;  Lifestyle:  married;  Occupation:  [employer];  Residence:  lives with husband and children;   ____________________________ REVIEW OF SYSTEMS General:  denies recent weight change, fatique or change in exercise tolerance.  Integumentary:no rashes or new skin lesions. Eyes: wears eye glasses/contact lenses Respiratory: denies dyspnea, cough, wheezing or hemoptysis. Cardiovascular:  please review HPI Abdominal: denies dyspepsia, GI bleeding, constipation, or diarrheaGenitourinary-Female: frequency, urgency Musculoskeletal:  cervical disc disease, arthritis of the neck Neurological:  headaches Psychiatric:  situational stress, insomnia  ____________________________ PHYSICAL EXAMINATION VITAL SIGNS  Blood Pressure:  134/70 Sitting, Right arm, regular cuff  , 128/72 Standing, Right arm and regular cuff   Pulse:  68/min. Weight:  130.00 lbs. Height:  63"BMI: 23  Constitutional:  pleasant white female, in no acute distress, anxious Skin:  warm and dry to touch, no apparent skin lesions, or masses noted. Head:  normocephalic, normal hair pattern, no masses or tenderness Eyes:  EOMS Intact, PERRLA, C and S clear, Funduscopic exam not done. ENT:  ears, nose and throat reveal no gross abnormalities.  Dentition good. Neck:  supple, without massess. No JVD, thyromegaly or carotid bruits. Carotid upstroke normal. Chest:  normal symmetry, clear to auscultation. Cardiac:  regular rhythm, normal S1 and S2, No S3 or S4, no murmurs, gallops or rubs detected. Abdomen:  abdomen soft,non-tender, no masses, no hepatospenomegaly, or aneurysm noted Peripheral Pulses:  the femoral,dorsalis pedis,  and posterior tibial pulses are full and equal bilaterally with no bruits auscultated. Extremities & Back:  no edema present Neurological:  no gross motor or sensory deficits noted, affect  appropriate, oriented x3. ____________________________ MOST RECENT LIPID PANEL 09/05/14  CHOL TOTL 227 mg/dl, LDL 161128 NM, HDL 72 mg/dl, TRIGLYCER 096137 mg/dl and CHOL/HDL 3.2 (Calc) ____________________________ IMPRESSIONS/PLAN  1. Coronary artery disease as manifested by [name] high coronary calcium score and 54 year old woman 2. Hypertension under treatment 3. Remote history of tobacco abuse 4. Anxiety  Recommendations:  Long extensive discussion regarding workup of her disease as well as risks and disease process. She very much wants [name] cardiac catheterization to assess the extent of her coronary atherosclerosis in her inability to exercise like she previously had. We discussed that she may not have flow-limiting stenosis and also that stenting was not indicated for stenoses are not flow limiting. We also discussed the possibility that this could be due to some microvascular disease also.  Cardiac catheterization was discussed with the patient including risks of myocardial infarction, death, stroke, bleeding, arrhythmia, dye allergy, or renal insufficiency. She understands and is willing to proceed. Possibility of percutaneous intervention at the same setting was also discussed with the patient including risks. We also discussed that the procedure could be done through the radial approach and that prior to any intervention likely would need to have FFR to determine if it is flow-limiting.  She has been recommended to take [name] high intensity statin as well as [name] beta blocker.  ____________________________ TODAYS ORDERS  1. Left Heart Cath/Poss PCI: First Available  2. Comprehensive Metabolic Panel: Today  3. Complete Blood Count: Today  4. Draw PT/INR: today  5. PTT: Today                       ____________________________ Cardiology Physician:  Darden PalmerW. Spencer Tilley, Jr. MD [facility]

## 2014-09-15 NOTE — Discharge Instructions (Signed)
Radial Site Care °Refer to this sheet in the next few weeks. These instructions provide you with information on caring for yourself after your procedure. Your caregiver may also give you more specific instructions. Your treatment has been planned according to current medical practices, but problems sometimes occur. Call your caregiver if you have any problems or questions after your procedure. °HOME CARE INSTRUCTIONS °· You may shower the day after the procedure. Remove the bandage (dressing) and gently wash the site with plain soap and water. Gently pat the site dry. °· Do not apply powder or lotion to the site. °· Do not submerge the affected site in water for 3 to 5 days. °· Inspect the site at least twice daily. °· Do not flex or bend the affected arm for 24 hours. °· No lifting over 5 pounds (2.3 kg) for 5 days after your procedure. °· Do not drive home if you are discharged the same day of the procedure. Have someone else drive you. °· You may drive 24 hours after the procedure unless otherwise instructed by your caregiver. °· Do not operate machinery or power tools for 24 hours. °· A responsible adult should be with you for the first 24 hours after you arrive home. °What to expect: °· Any bruising will usually fade within 1 to 2 weeks. °· Blood that collects in the tissue (hematoma) may be painful to the touch. It should usually decrease in size and tenderness within 1 to 2 weeks. °SEEK IMMEDIATE MEDICAL CARE IF: °· You have unusual pain at the radial site. °· You have redness, warmth, swelling, or pain at the radial site. °· You have drainage (other than a small amount of blood on the dressing). °· You have chills. °· You have a fever or persistent symptoms for more than 72 hours. °· You have a fever and your symptoms suddenly get worse. °· Your arm becomes pale, cool, tingly, or numb. °· You have heavy bleeding from the site. Hold pressure on the site and call 911. °Document Released: 05/25/2010 Document  Revised: 07/15/2011 Document Reviewed: 05/25/2010 °ExitCare® Patient Information ©2015 [employer]. This information is not intended to replace advice given to you by your health care provider. Make sure you discuss any questions you have with your health care provider. ° °

## 2014-09-15 NOTE — Interval H&P Note (Signed)
History and Physical Interval Note:  09/15/2014 7:37 AM  Carla RiegerJill [name]  has presented today for cardiac cath with the diagnosis of CAD/unstable angina.  The various methods of treatment have been discussed with the patient and family. After consideration of risks, benefits and other options for treatment, the patient has consented to  Procedure(s): Left Heart Cath and Coronary Angiography (N/A) as a surgical intervention .  The patient's history has been reviewed, patient examined, no change in status, stable for surgery.  I have reviewed the patient's chart and labs.  Questions were answered to the patient's satisfaction.    Cath Lab Visit (complete for each Cath Lab visit)  Clinical Evaluation Leading to the Procedure:   ACS: No.  Non-ACS:    Anginal Classification: CCS III  Anti-ischemic medical therapy: Maximal Therapy (2 or more classes of medications)  Non-Invasive Test Results: No ischemic changes on exercise treadmill stress test  Prior CABG: No previous CABG         Maryanna Stuber

## 2014-09-16 MED FILL — Heparin Sodium (Porcine) 2 Unit/ML in Sodium Chloride 0.9%: INTRAMUSCULAR | Qty: 1000 | Status: AC

## 2014-09-16 MED FILL — Lidocaine HCl Local Preservative Free (PF) Inj 1%: INTRAMUSCULAR | Qty: 30 | Status: AC

## 2015-05-24 ENCOUNTER — Encounter (HOSPITAL_COMMUNITY): Payer: Self-pay | Admitting: Emergency Medicine

## 2015-05-24 ENCOUNTER — Emergency Department (HOSPITAL_COMMUNITY): Payer: BC Managed Care – PPO

## 2015-05-24 ENCOUNTER — Emergency Department (HOSPITAL_COMMUNITY)
Admission: EM | Admit: 2015-05-24 | Discharge: 2015-05-24 | Discharge status: Against Medical Advice | Payer: BC Managed Care – PPO | Attending: Emergency Medicine | Admitting: Emergency Medicine

## 2015-05-24 DIAGNOSIS — R079 Chest pain, unspecified: Secondary | ICD-10-CM | POA: Diagnosis not present

## 2015-05-24 DIAGNOSIS — I10 Essential (primary) hypertension: Secondary | ICD-10-CM | POA: Diagnosis not present

## 2015-05-24 DIAGNOSIS — Z87891 Personal history of nicotine dependence: Secondary | ICD-10-CM | POA: Diagnosis not present

## 2015-05-24 HISTORY — DX: Aortic aneurysm of unspecified site, without rupture: I71.9

## 2015-05-24 LAB — BASIC METABOLIC PANEL
Anion gap: 12 (ref 5–15)
BUN: 12 mg/dL (ref 6–20)
CO2: 26 mmol/L (ref 22–32)
CREATININE: 0.65 mg/dL (ref 0.44–1.00)
Calcium: 9.5 mg/dL (ref 8.9–10.3)
Chloride: 103 mmol/L (ref 101–111)
Glucose, Bld: 101 mg/dL — ABNORMAL HIGH (ref 65–99)
Potassium: 3.4 mmol/L — ABNORMAL LOW (ref 3.5–5.1)
SODIUM: 141 mmol/L (ref 135–145)

## 2015-05-24 LAB — I-STAT TROPONIN, ED: TROPONIN I, POC: 0 ng/mL (ref 0.00–0.08)

## 2015-05-24 LAB — CBC
HCT: 39 % (ref 36.0–46.0)
Hemoglobin: 12.8 g/dL (ref 12.0–15.0)
MCH: 30.3 pg (ref 26.0–34.0)
MCHC: 32.8 g/dL (ref 30.0–36.0)
MCV: 92.2 fL (ref 78.0–100.0)
PLATELETS: 217 10*3/uL (ref 150–400)
RBC: 4.23 MIL/uL (ref 3.87–5.11)
RDW: 13.3 % (ref 11.5–15.5)
WBC: 5.8 10*3/uL (ref 4.0–10.5)

## 2015-05-24 NOTE — ED Notes (Signed)
Pt states on Friday she started right and left sided chest pain that feels like a burning. Pt denies any sob or n.v or other associated symptoms. Pt is warm and dry.

## 2015-09-01 ENCOUNTER — Other Ambulatory Visit: Payer: Self-pay | Admitting: Cardiology

## 2015-09-01 DIAGNOSIS — I712 Thoracic aortic aneurysm, without rupture, unspecified: Secondary | ICD-10-CM

## 2015-09-05 ENCOUNTER — Ambulatory Visit
Admission: RE | Admit: 2015-09-05 | Discharge: 2015-09-05 | Disposition: A | Discharge status: Home / Self Care | Payer: BC Managed Care – PPO | Source: Ambulatory Visit | Attending: Cardiology | Admitting: Cardiology

## 2015-09-05 DIAGNOSIS — I712 Thoracic aortic aneurysm, without rupture, unspecified: Secondary | ICD-10-CM

## 2016-03-27 ENCOUNTER — Ambulatory Visit (INDEPENDENT_AMBULATORY_CARE_PROVIDER_SITE_OTHER): Payer: BC Managed Care – PPO | Admitting: Interventional Cardiology

## 2016-03-27 ENCOUNTER — Encounter: Payer: Self-pay | Admitting: Interventional Cardiology

## 2016-03-27 VITALS — BP 110/68 | HR 61 | Ht 63.5 in | Wt 124.2 lb

## 2016-03-27 DIAGNOSIS — E7849 Other hyperlipidemia: Secondary | ICD-10-CM

## 2016-03-27 DIAGNOSIS — I2584 Coronary atherosclerosis due to calcified coronary lesion: Secondary | ICD-10-CM

## 2016-03-27 DIAGNOSIS — R0609 Other forms of dyspnea: Secondary | ICD-10-CM

## 2016-03-27 DIAGNOSIS — E784 Other hyperlipidemia: Secondary | ICD-10-CM | POA: Diagnosis not present

## 2016-03-27 DIAGNOSIS — R55 Syncope and collapse: Secondary | ICD-10-CM

## 2016-03-27 DIAGNOSIS — I77810 Thoracic aortic ectasia: Secondary | ICD-10-CM

## 2016-03-27 DIAGNOSIS — I10 Essential (primary) hypertension: Secondary | ICD-10-CM

## 2016-03-27 DIAGNOSIS — I2510 Atherosclerotic heart disease of native coronary artery without angina pectoris: Secondary | ICD-10-CM

## 2016-03-27 DIAGNOSIS — Z86718 Personal history of other venous thrombosis and embolism: Secondary | ICD-10-CM | POA: Insufficient documentation

## 2016-03-27 MED ORDER — METOPROLOL SUCCINATE ER 50 MG PO TB24: 50.0000 mg | ORAL_TABLET | Freq: Every day | ORAL | 3 refills | Status: DC

## 2016-03-27 MED ORDER — OLMESARTAN MEDOXOMIL 20 MG PO TABS: 20.0000 mg | ORAL_TABLET | Freq: Every day | ORAL | 3 refills | Status: DC

## 2016-03-27 NOTE — Patient Instructions (Signed)
Medication Instructions:  1) INCREASE Metoprolol Succinate to 50mg  once daily 2) DECREASE Benicar 20mg  once daily  Labwork: None  Testing/Procedures: None  Follow-Up: Your physician wants you to follow-up in: 1 year with Dr. Katrinka BlazingSmith. You will receive a reminder letter in the mail two months in advance. If you don't receive a letter, please call our office to schedule the follow-up appointment.   Any Other Special Instructions Will Be Listed Below (If Applicable).  Record your blood pressure and call in about 2-3 weeks with those recordings.    If you need a refill on your cardiac medications before your next appointment, please call your pharmacy.

## 2016-03-27 NOTE — Progress Notes (Signed)
Cardiology Office Note    Date:  03/27/2016   ID:  Carla [name], DOB 1960/09/17, MRN [id number]  PCP:  Gweneth Dimitri, MD  Cardiologist: Lesleigh Noe, MD   Chief Complaint  Patient presents with  . 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 She comes now for second opinion cardiology consultation and to establish a longitudinal care. 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  Carla [name] is active. She runs half marathons She denies chest pain. She is concerned because her heart rate is faster than it used to be with training. She also feels that she is being prevented from attending the level of fitness that she has previously been able to achieve. She feels there is inappropriate dyspnea on exertion at moderate to high levels of physical activity. There is no chest discomfort associated with training. 1-1/2 years ago evaluations began after she developed dyspnea while hiking in [place] that she felt was disproportionately severe for her level of fitness. She also has a history of DVT that was treated with anticoagulation therapy greater than 12 months ago and felt to be related to immobility due to frequent plane flights. Pulmonary emboli were not diagnosed.  She developed essential hypertension a proximally 6 years ago and medications have been started and progressively increased over the last several years.  She had a syncopal episode several months ago preceded by nausea. It occurred in the setting of alcohol ingestion, a busy day at work, and also self adjustment in her  blood pressure medications.  Over this time frame the cardiac workup included a stress echo which was unremarkable, a chest CT which identified ectasia of the ascending aorta and coronary calcification, coronary arteriography, and the addition of beta blocker therapy because of her aorta.  She is interested in whether I have any thoughts about her perceived decreased exertional tolerance  Past Medical History:  Diagnosis Date  . Aortic aneurysm (HCC)   . Arthralgia   . Carpal tunnel syndrome   . DVT (deep venous thrombosis) (HCC)   . Fibromyalgia   . HTN (hypertension)    [facility]  . Palpitation     Past Surgical History:  Procedure Laterality Date  . CARDIAC CATHETERIZATION N/A 09/15/2014   Procedure: Left Heart Cath and Coronary Angiography;  Surgeon: Kathleene Hazel, MD;  [facility] INVASIVE CV LAB;  Service: Cardiovascular;  Laterality: N/A;    Current Medications: Outpatient Medications Prior to Visit  Medication Sig Dispense Refill  . atenolol (TENORMIN) 25 MG tablet Take 1 tablet (25 mg total) by mouth daily. (Patient not taking: Reported on 09/13/2014) 30 tablet 1  . atorvastatin (LIPITOR) 40 MG tablet Take 40 mg by mouth daily.    . cholecalciferol (VITAMIN D) 1000 UNITS tablet Take 1,000 Units by mouth daily.    . Cyanocobalamin (VITAMIN B 12 PO) Take 1 tablet by mouth daily.    . hydrochlorothiazide (HYDRODIURIL) 25 MG tablet Take 25 mg by mouth daily.    Marland Kitchen losartan-hydrochlorothiazide (HYZAAR) 100-25 MG per tablet Take 1 tablet by mouth daily. (Patient not taking: Reported on 09/13/2014) 30 tablet 11  . olmesartan (BENICAR) 20 MG tablet Take  20 mg by mouth daily.     No facility-administered medications prior to visit.      Allergies:   Verapamil   Social History   Social History  . Marital status: Married    Spouse name: N/A  . Number of children: N/A  . Years of education: N/A   Social History Main Topics  . Smoking status: Former Games developermoker  . Smokeless  tobacco: Never Used     Comment: quit 2003-2004  . Alcohol use Yes  . Drug use: No  . Sexual activity: Not Asked   Other Topics Concern  . None   Social History Narrative  . None     Family History:  The patient's family history includes Aneurysm in her mother.   ROS:   Please see the history of present illness.    Some difficulty with memory . She has difficulty sleeping. All other systems reviewed and are negative.   PHYSICAL EXAM:   VS:  BP 110/68 (BP Location: Right Arm)   Pulse 61   Ht 5' 3.5" (1.613 m)   Wt 124 lb 3.2 oz (56.3 kg)   LMP 10/31/2013   BMI 21.66 kg/m    GEN: Well nourished, well developed, in no acute distress  HEENT: normal  Neck: no JVD, carotid bruits, or masses Cardiac: RRR; no murmurs, rubs, or gallops,no edema  Respiratory:  clear to auscultation bilaterally, normal work of breathing GI: soft, nontender, nondistended, + BS MS: no deformity or atrophy  Skin: warm and dry, no rash Neuro:  Alert and Oriented x 3, Strength and sensation are intact Psych: euthymic mood, full affect  Wt Readings from Last 3 Encounters:  03/27/16 124 lb 3.2 oz (56.3 kg)  05/24/15 130 lb (59 kg)  09/15/14 130 lb (59 kg)      Studies/Labs Reviewed:   EKG:  EKG  Sinus rhythm with normal appearance of EKG poor R-wave progression is related to lead position.  Recent Labs: 05/24/2015: BUN 12; Creatinine, Ser 0.65; Hemoglobin 12.8; Platelets 217; Potassium 3.4; Sodium 141   Lipid Panel No results found for: CHOL, TRIG, HDL, CHOLHDL, VLDL, LDLCALC, LDLDIRECT  Additional studies/ records that were reviewed today include:  CT scan chest, 2017: IMPRESSION: 1. Ectatic ascending thoracic aorta, maximum diameter 3.8 cm, stable since 09/01/2014 using similar measurement technique. 2. Two vessel coronary atherosclerosis. 3. Mild centrilobular and minimal paraseptal emphysema.  Coronary angiography: 09/2014 Conclusion    Mid RCA lesion, 10% stenosed.  Prox LAD  lesion, 10% stenosed.  Mid LAD to Dist LAD lesion, 10% stenosed.   1. Mild non-obstructive coronary artery disease 2. Normal LV systolic function  Recommendations: Medical management of CAD. Follow up as planned with Dr. Donnie Ahoilley.    June 2015: Stress echo Study Conclusions  Stress ECG conclusions: The stress ECG was normal.  Impressions:  - Normal study after maximal exercise. Bruce protocol. Stress echocardiography.   ASSESSMENT:    1. Dyspnea on exertion   2. Hypertension, unspecified type   3. Vasovagal syncope   4. Coronary artery calcification of native artery   5. Aortic ectasia, thoracic (HCC)   6. Other hyperlipidemia   7. History of DVT (deep vein thrombosis)      PLAN:  In order of problems listed above:  1. A significant cardiovascular workup has been performed. Dyspnea could be related to diastolic dysfunction. She is also not had pulmonary emboli excluded given her history of DVT. Family other possibility that has not been excluded is sleep apnea  especially given the history of difficulty sleeping. She seems to be doing well at the current time, and I have not recommended that any of these other studies be done unless symptoms worsen. 2. Control blood pressure to less than 130/90 mmHg. Because of the enlarged aorta on CT scan we will increase the beta blocker intensity and may need to decrease the dose of Benicar. The current recommendation is 50 mg of metoprolol succinate daily. Decreased to Benicar 20 mg per day. Monitor blood pressure and if blood pressures consistently greater than 125/85 mmHg, she should resume the 40 mg of Benicar per day she will monitor her pressures and call us with results. 3. She had an episode of fainting associated with alcohol intake. There was premonitory nausea. Vasovagal seems to be a reasonable diagnosis. No specific workup is felt indicated. 4. She does have coronary disease and is not obstructed. I doubt that this has anything  to do with the exertional dyspnea unless she also has microvascular CAD. Further increasing her beta blocker will be helpful if circulation is contributing. 5. Further increasing the beta blocker intensity to 50 mg per day will help to decrease progression of aortic size. 6. Continue with aggressive lipid lowering and target LDL less than equal to 70. 7. Consider CT pulmonary angiogram to exclude evidence of recurrent pulmonary emboli. This might best be evaluated by ventilation perfusion scanning. I will feel this is necessary at this time.    Medication Adjustments/Labs and Tests Ordered: Current medicines are reviewed at length with the patient today.  Concerns regarding medicines are outlined above.  Medication changes, Labs and Tests ordered today are listed in the Patient Instructions below. Patient Instructions  Medication Instructions:  1) INCREASE Metoprolol Succinate to 50mg  once daily 2) DECREASE Benicar 20mg  once daily  Labwork: None  Testing/Procedures: None  Follow-Up: Your physician wants you to follow-up in: 1 year with Dr. Katrinka BlazingSmith. You will receive a reminder letter in the mail two months in advance. If you don't receive a letter, please call our office to schedule the follow-up appointment.   Any Other Special Instructions Will Be Listed Below (If Applicable).  Record your blood pressure and call in about 2-3 weeks with those recordings.    If you need a refill on your cardiac medications before your next appointment, please call your pharmacy.      Signed, Lesleigh NoeHenry W Smith III, MD  03/27/2016 1:05 PM    [facility] [address], [place], [place]  [address] Phone: [phone]; Fax: [phone]

## 2016-04-05 ENCOUNTER — Encounter: Payer: Self-pay | Admitting: Interventional Cardiology

## 2016-06-12 ENCOUNTER — Encounter: Payer: Self-pay | Admitting: Interventional Cardiology

## 2016-06-12 ENCOUNTER — Telehealth: Payer: Self-pay | Admitting: *Deleted

## 2016-06-12 DIAGNOSIS — R0681 Apnea, not elsewhere classified: Secondary | ICD-10-CM

## 2016-06-12 DIAGNOSIS — R0683 Snoring: Secondary | ICD-10-CM

## 2016-06-12 NOTE — Telephone Encounter (Signed)
-----   Message from Lyn RecordsHenry W Smith, MD sent at 06/12/2016  4:04 PM EST ----- Regarding: needs sleep study Snoring and apnea during sleep, Pleas schedule sleep study

## 2016-06-12 NOTE — Telephone Encounter (Signed)
Order placed for sleep study.

## 2016-07-25 ENCOUNTER — Ambulatory Visit (HOSPITAL_BASED_OUTPATIENT_CLINIC_OR_DEPARTMENT_OTHER): Payer: BC Managed Care – PPO | Attending: Interventional Cardiology | Admitting: Cardiology

## 2016-07-25 DIAGNOSIS — G4734 Idiopathic sleep related nonobstructive alveolar hypoventilation: Secondary | ICD-10-CM | POA: Diagnosis not present

## 2016-07-25 DIAGNOSIS — G4736 Sleep related hypoventilation in conditions classified elsewhere: Secondary | ICD-10-CM | POA: Diagnosis not present

## 2016-07-25 DIAGNOSIS — R0683 Snoring: Secondary | ICD-10-CM | POA: Insufficient documentation

## 2016-07-25 DIAGNOSIS — R0681 Apnea, not elsewhere classified: Secondary | ICD-10-CM

## 2016-07-28 NOTE — Procedures (Signed)
   Patient Name: Carla [name], Jill Study Date: 07/25/2016 Gender: Female D.O.B: [birth year] Age (years): 55 Referring Provider: Verdis PrimeHenry Smith Height (inches): 63 Interpreting Physician: Armanda Magicraci Herlinda Heady MD, ABSM Weight (lbs): 129 RPSGT: Rolene ArbourMcConnico, Yvonne BMI: 23 MRN: [id number] Neck Size: 13.00  CLINICAL INFORMATION Sleep Study Type: NPSG  Indication for sleep study: Snoring, Witnessed Apneas  Epworth Sleepiness Score: 5  SLEEP STUDY TECHNIQUE As per the AASM Manual for the Scoring of Sleep and Associated Events v2.3 (April 2016) with a hypopnea requiring 4% desaturations.  The channels recorded and monitored were frontal, central and occipital EEG, electrooculogram (EOG), submentalis EMG (chin), nasal and oral airflow, thoracic and abdominal wall motion, anterior tibialis EMG, snore microphone, electrocardiogram, and pulse oximetry.  MEDICATIONS Medications self-administered by patient taken the night of the study : N/A  SLEEP ARCHITECTURE The study was initiated at 11:12:16 PM and ended at 5:20:08 AM.  Sleep onset time was 11.3 minutes and the sleep efficiency was 33.7%. The total sleep time was 124.0 minutes.  Stage REM latency was 80.0 minutes.  The patient spent 8.87% of the night in stage N1 sleep, 76.21% in stage N2 sleep, 0.00% in stage N3 and 14.92% in REM.  Alpha intrusion was absent.  Supine sleep was 98.79%.  RESPIRATORY PARAMETERS The overall apnea/hypopnea index (AHI) was 1.9 per hour. There were 0 total apneas, including 0 obstructive, 0 central and 0 mixed apneas. There were 4 hypopneas and 2 RERAs.  The AHI during Stage REM sleep was 6.5 per hour.  AHI while supine was 2.0 per hour.  The mean oxygen saturation was 90.34%. The minimum SpO2 during sleep was 86.00%.  Moderate snoring was noted during this study.  CARDIAC DATA The 2 lead EKG demonstrated sinus rhythm. The mean heart rate was 57.13 beats per minute. Other EKG findings include: None.  LEG  MOVEMENT DATA The total PLMS were 0 with a resulting PLMS index of 0.00. Associated arousal with leg movement index was 0.0 .  IMPRESSIONS - No significant obstructive sleep apnea occurred during this study (AHI = 1.9/h) but sleep efficiency was poor and the patient only slept 2 hours. - No significant central sleep apnea occurred during this study (CAI = 0.0/h). - Oxygen desaturation was noted during this study (Min O2 = 86.00%). - The patient snored with Moderate snoring volume. - No cardiac abnormalities were noted during this study. - Clinically significant periodic limb movements did not occur during sleep. No significant associated arousals.  DIAGNOSIS - Nocturnal Hypoxemia (327.26 [G47.36 ICD-10])  RECOMMENDATIONS - As patient had poor sleep efficiency only sleeping 2 hours and had evidence of nocturnal hypoxemia, recommend repeat sleep study with sleep aide.  - Avoid alcohol, sedatives and other CNS depressants that may worsen sleep apnea and disrupt normal sleep architecture. - Sleep hygiene should be reviewed to assess factors that may improve sleep quality. - Weight management and regular exercise should be initiated or continued if appropriate.  Armanda Magicraci Sela Falk Diplomate, American Board of Sleep Medicine  ELECTRONICALLY SIGNED ON:  07/28/2016, 5:50 PM [facility] PH: [phone]   FX: [phone] ACCREDITED BY THE AMERICAN ACADEMY OF SLEEP MEDICINE

## 2016-07-30 ENCOUNTER — Telehealth: Payer: Self-pay | Admitting: *Deleted

## 2016-07-30 DIAGNOSIS — R0902 Hypoxemia: Secondary | ICD-10-CM

## 2016-07-30 MED ORDER — ESZOPICLONE 2 MG PO TABS: 2.0000 mg | ORAL_TABLET | Freq: Every evening | ORAL | 0 refills | Status: DC | PRN

## 2016-07-30 NOTE — Telephone Encounter (Signed)
-----   Message from Quintella Reichertraci R Turner, MD sent at 07/28/2016  5:54 PM EDT ----- Please let patient know that she has significant hypoxemia on sleep study but no enough apneas to qualify for CPAP.  She had an inadequate study due to poor sleep.  Please set up for repeat split night sleep study with a sleep aide  Please order Lunesta 2mg  (#1 table with no refills) to be taken before bedtime at sleep study.

## 2016-07-30 NOTE — Telephone Encounter (Signed)
Called the patient and gave her the sleep study results, she verbalized understanding, stated she knew she only slept 2 hours and agreed to a repeat study

## 2016-07-31 NOTE — Telephone Encounter (Signed)
Lunesta called in to patients pharmacy and patient notified

## 2016-08-11 ENCOUNTER — Ambulatory Visit (HOSPITAL_BASED_OUTPATIENT_CLINIC_OR_DEPARTMENT_OTHER): Payer: BC Managed Care – PPO | Attending: Cardiology | Admitting: Cardiology

## 2016-08-11 DIAGNOSIS — R0902 Hypoxemia: Secondary | ICD-10-CM | POA: Insufficient documentation

## 2016-08-11 DIAGNOSIS — R0683 Snoring: Secondary | ICD-10-CM | POA: Diagnosis not present

## 2016-08-11 DIAGNOSIS — I10 Essential (primary) hypertension: Secondary | ICD-10-CM

## 2016-08-13 NOTE — Progress Notes (Signed)
Please order home sleep study. 

## 2016-08-13 NOTE — Procedures (Signed)
   Patient Name: Carla [name], Coletta Date: 08/11/2016 Gender: Female D.O.B: Mar 24, 1961 Age (years): 69 Referring Provider: Armanda Magic MD, ABSM Height (inches): 63 Interpreting Physician: Armanda Magic MD, ABSM Weight (lbs): 129 RPSGT: Rolene Arbour BMI: 23 MRN: [id number] Neck Size: 13.00  CLINICAL INFORMATION Sleep Study Type: NPSG  Indication for sleep study: Hypertension, Snoring  Epworth Sleepiness Score: 5  Most recent polysomnogram dated 07/25/2016 revealed an AHI of 1.9/h and RDI of 2.9/h.  SLEEP STUDY TECHNIQUE As per the AASM Manual for the Scoring of Sleep and Associated Events v2.3 (April 2016) with a hypopnea requiring 4% desaturations.  The channels recorded and monitored were frontal, central and occipital EEG, electrooculogram (EOG), submentalis EMG (chin), nasal and oral airflow, thoracic and abdominal wall motion, anterior tibialis EMG, snore microphone, electrocardiogram, and pulse oximetry.  MEDICATIONS Medications self-administered by patient taken the night of the study : LUNESTA  SLEEP ARCHITECTURE The study was initiated at 10:38:51 PM and ended at 4:40:45 AM.  Sleep onset time was 41.3 minutes and the sleep efficiency was 10.6%. The total sleep time was 38.5 minutes.  Stage REM latency was N/A minutes.  The patient spent 18.18% of the night in stage N1 sleep, 81.82% in stage N2 sleep, 0.00% in stage N3 and 0.00% in REM.  Alpha intrusion was absent.  Supine sleep was 28.57%.  RESPIRATORY PARAMETERS The overall apnea/hypopnea index (AHI) was 0.0 per hour. There were 0 total apneas, including 0 obstructive, 0 central and 0 mixed apneas. There were 0 hypopneas and 0 RERAs.  The AHI during Stage REM sleep was N/A per hour.  AHI while supine was 0.0 per hour.  The mean oxygen saturation was 92.88%. The minimum SpO2 during sleep was 88.00%.  Soft snoring was noted during this study.  CARDIAC DATA The 2 lead EKG demonstrated sinus  rhythm. The mean heart rate was 50.51 beats per minute. Other EKG findings include: None.  LEG MOVEMENT DATA The total PLMS were 0 with a resulting PLMS index of 0.00. Associated arousal with leg movement index was 0.0 .  IMPRESSIONS - No significant obstructive sleep apnea occurred during this study (AHI = 0.0/h). - No significant central sleep apnea occurred during this study (CAI = 0.0/h). - Oxygen desaturation was noted during this study (Min O2 = 88.00%). - The patient snored with Soft snoring volume. - No cardiac abnormalities were noted during this study. - Clinically significant periodic limb movements did not occur during sleep. No significant associated arousals.  DIAGNOSIS - Snoring  RECOMMENDATIONS - The patient did not have adequate sleep time to assess for sleep apnea. Recommend home sleep study to see if patient will sleep better at home. - Avoid alcohol, sedatives and other CNS depressants that may worsen sleep apnea and disrupt normal sleep architecture. - Sleep hygiene should be reviewed to assess factors that may improve sleep quality. - Weight management and regular exercise should be initiated or continued if appropriate.  Armanda Magic Diplomate, American Board of Sleep Medicine  ELECTRONICALLY SIGNED ON:  08/13/2016, 3:17 PM [facility] PH: [phone]   FX: [phone] ACCREDITED BY THE AMERICAN ACADEMY OF SLEEP MEDICINE

## 2016-08-14 ENCOUNTER — Encounter: Payer: Self-pay | Admitting: Interventional Cardiology

## 2016-08-15 ENCOUNTER — Telehealth: Payer: Self-pay | Admitting: *Deleted
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
## 2016-08-20 ENCOUNTER — Telehealth: Payer: Self-pay | Admitting: Interventional Cardiology

## 2016-08-20 NOTE — Telephone Encounter (Signed)
New Message     Please call  She would like to go over her sleep study results with you

## 2016-08-22 ENCOUNTER — Telehealth: Payer: Self-pay | Admitting: *Deleted

## 2016-08-22 NOTE — Telephone Encounter (Addendum)
Late Entry: Called patient to inform her of her results and doctor's recommendations. She verbalized understanding and agreed to the treatment.  Today 08/22/16 She understands Dr. Mayford Knife has recommended a home sleep study for her. She understands [facility] will be contacting her for new set up. She understands to call if [facility] does not contact her in a timely manner. Home Sleep Test is ordered today.  Awaiting confirmation of order received from [facility].

## 2016-08-22 NOTE — Telephone Encounter (Signed)
Call the patient and spoke to her concerning her results and she verbalized understanding. She understands Dr. Mayford Knife has recommended a home sleep study for her and she agrees to the treatment. She was grateful and thanked me for calling.

## 2016-08-22 NOTE — Telephone Encounter (Signed)
-----   Message from Quintella Reichert, MD sent at 08/13/2016  6:32 PM EDT -----   ----- Message ----- From: Reesa Chew, CMA Sent: 08/13/2016   6:19 PM To: Quintella Reichert, MD  Recommend home sleep study to see if patient will sleep better at home. To Dr. Mayford Knife, please advise how to proceed.

## 2016-08-23 IMAGING — CT CT CHEST W/O CM
3 of 4 series · 17 of 30 positions shown, 19 images · non-contrast
Comparison: None.

CLINICAL DATA: Former smoker with hoarseness.

EXAM:
CT CHEST WITHOUT CONTRAST
TECHNIQUE: Multidetector CT imaging of the chest was performed following the
standard protocol without IV contrast.

[Series 3: chest w/o · axial · non-contrast · 0.61mm/px · z∈[-262,-62]mm · 5 of 60 slices shown]
[im 10/60  lung]
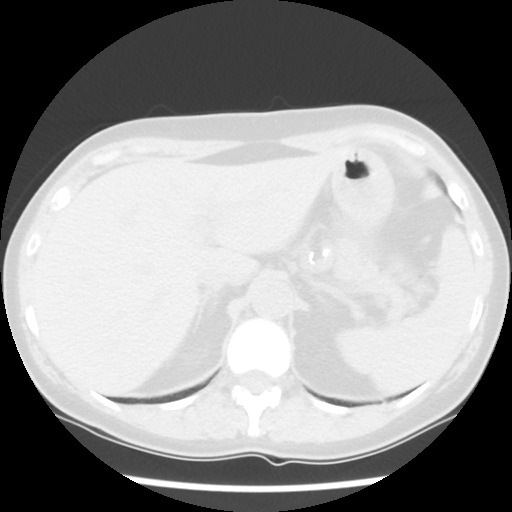
[im 20/60  lung]
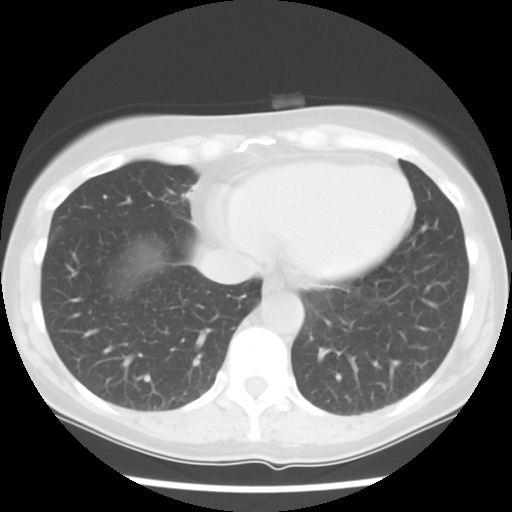
[im 30/60  lung]
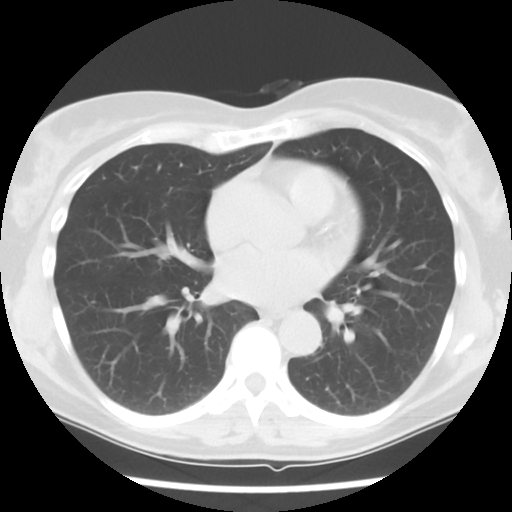
[im 40/60  lung]
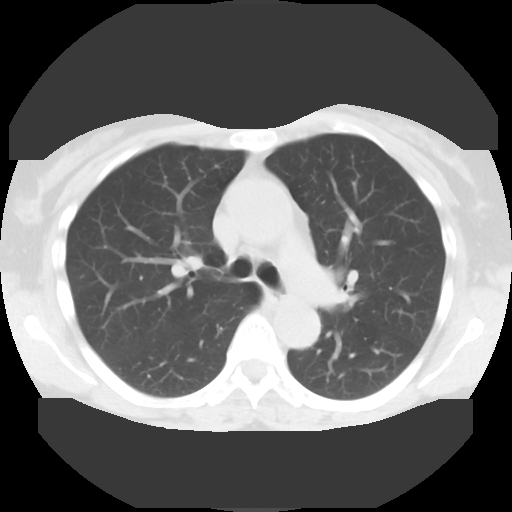
[im 50/60  lung]
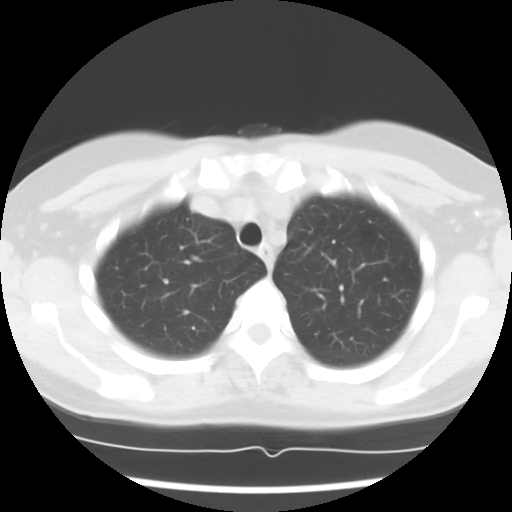

[Series 4: lung windows · axial · 0.61mm/px · z∈[-257,-57]mm · 5 of 62 slices shown, 7 images]
[im 11/62  mediastinal]
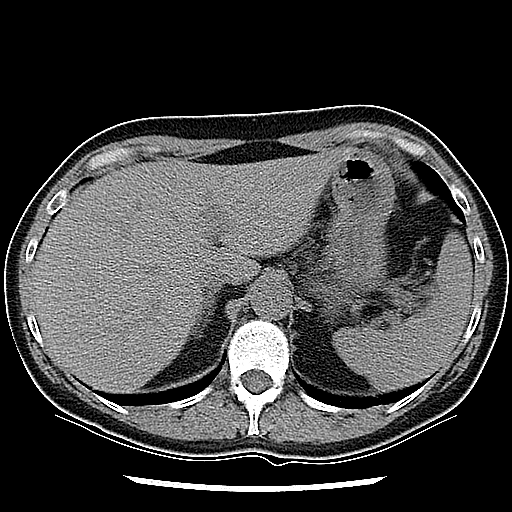
[im 11/62  lung]
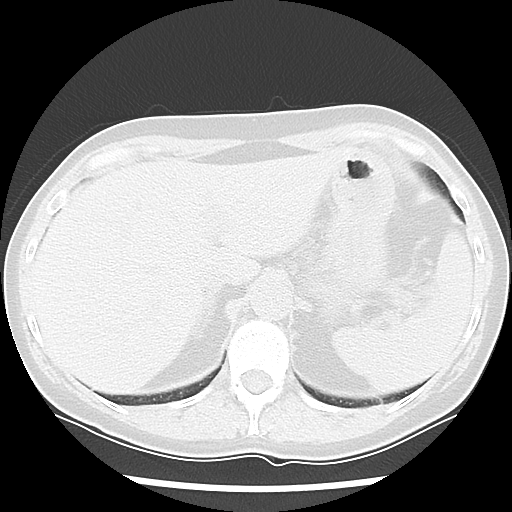
[im 21/62  lung]
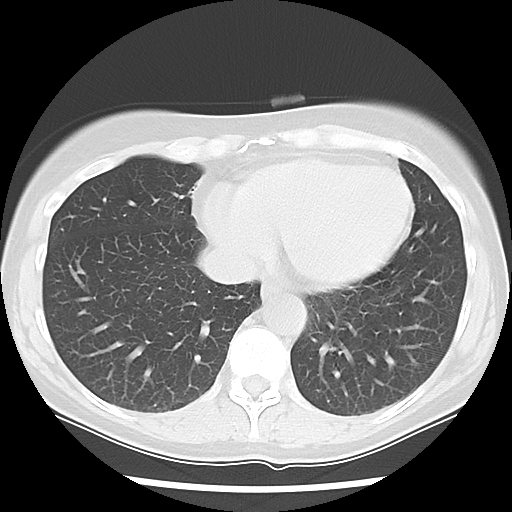
[im 31/62  lung]
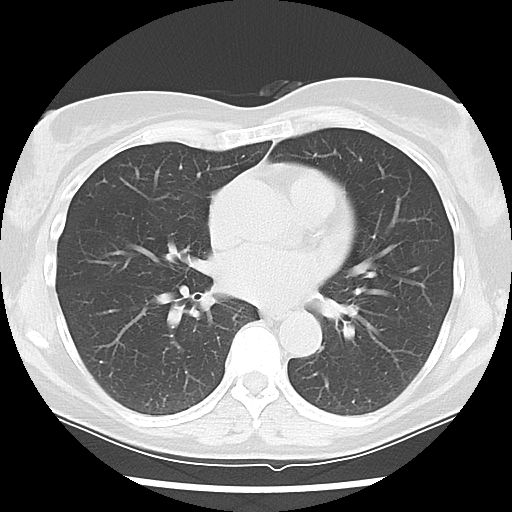
[im 41/62  lung]
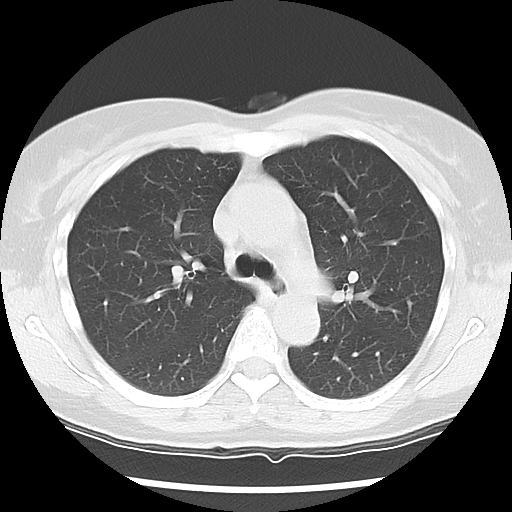
[im 51/62  mediastinal]
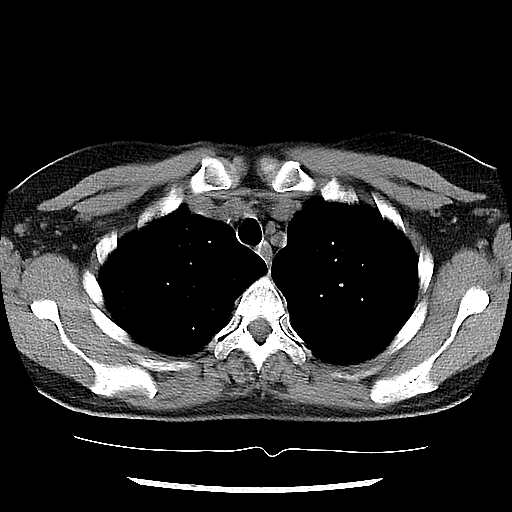
[im 51/62  lung]
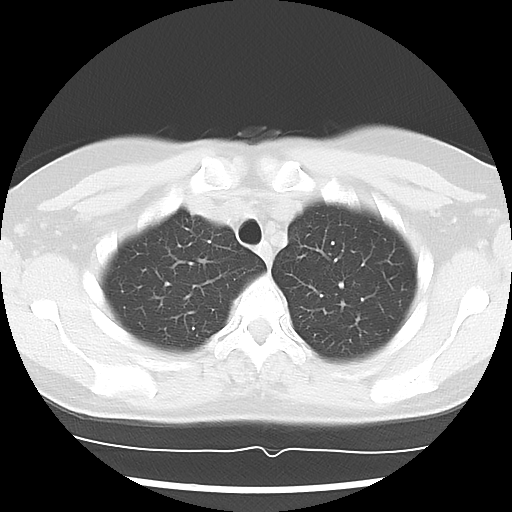

[Series 602: sagittal body · sagittal · 0.61mm/px · 7 of 125 slices shown]
[im 10/125  mediastinal]
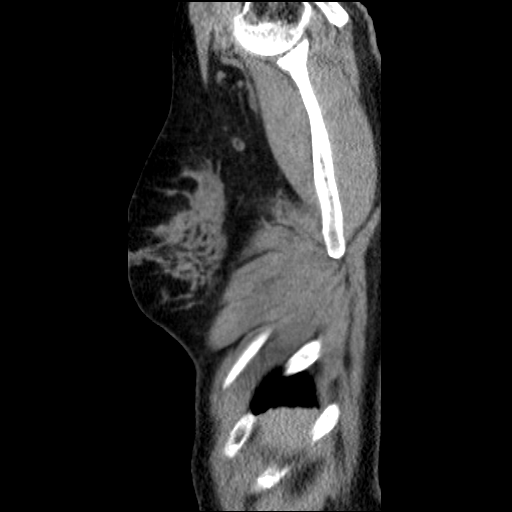
[im 29/125  mediastinal]
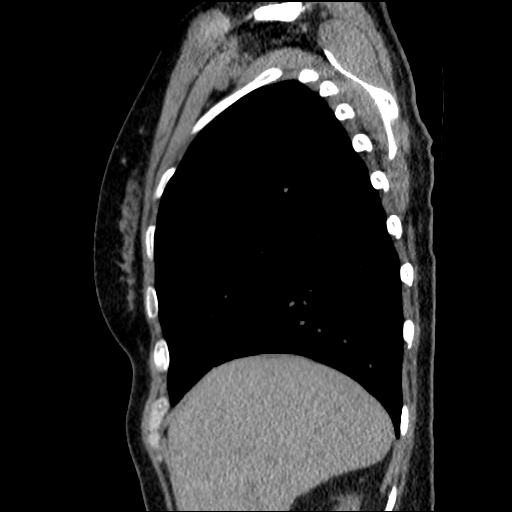
[im 39/125  mediastinal]
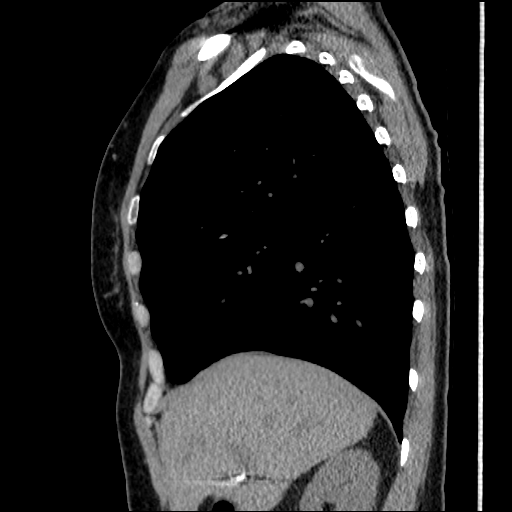
[im 58/125  mediastinal]
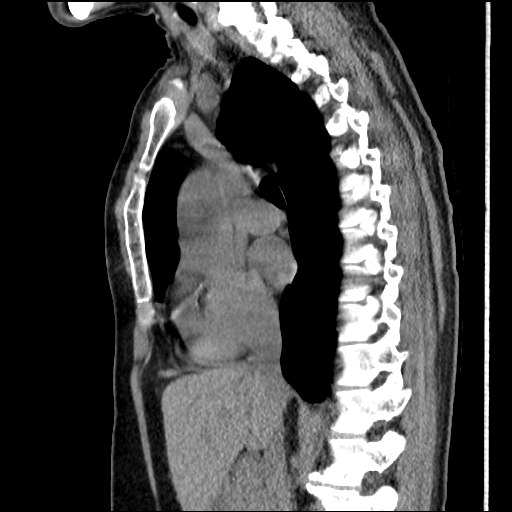
[im 67/125  mediastinal]
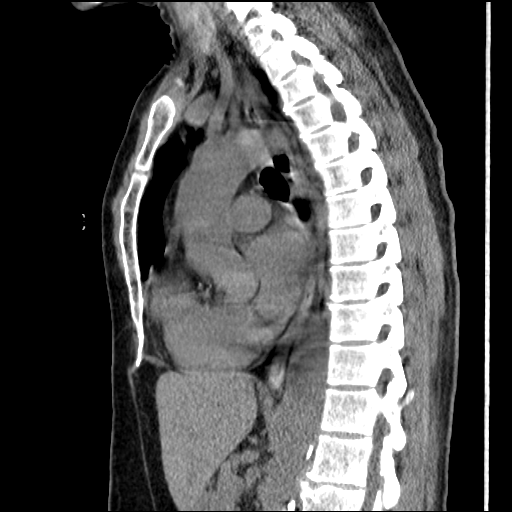
[im 86/125  mediastinal]
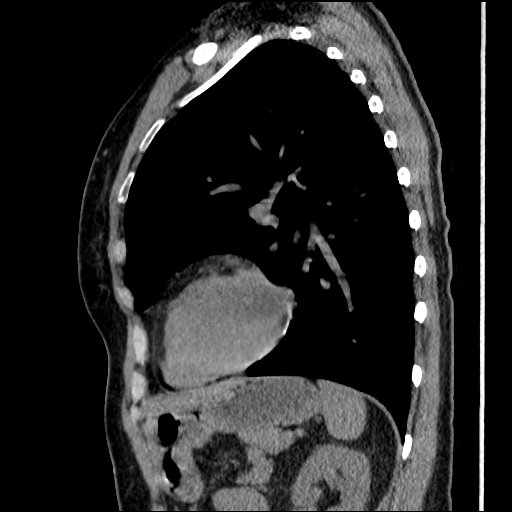
[im 96/125  mediastinal]
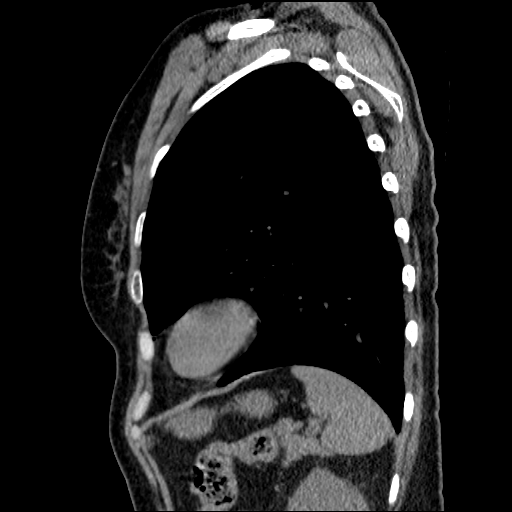

[17 of 30 positions shown; findings below may reference images not displayed]

FINDINGS: THORACIC INLET/BODY WALL:

No acute abnormality.

MEDIASTINUM:

Normal heart size. No pericardial effusion. Atherosclerotic
calcifications seen along the right coronary artery and proximal
LAD. 41 mm diameter ascending aorta (measured sagittally). No acute
vascular abnormality. No adenopathy.

LUNG WINDOWS:

Minimal scarring in the medial segment right middle lobe. No
consolidation. No effusion. No suspicious pulmonary nodule.

UPPER ABDOMEN:

Cholecystectomy.

OSSEOUS:

No acute findings. Focally advanced degenerative disc disease at
C6-7.
IMPRESSION: 1. No explanation for hoarseness.
2. 41 mm diameter ascending aorta. Recommend annual imaging followup
by CTA or MRA. This recommendation follows 5151
ACCF/AHA/AATS/ACR/ASA/SCA/CAPLEA/NOMASIBULELE/LASHANDA/NYA Guidelines for the
Diagnosis and Management of Patients with Thoracic Aortic Disease.
Circulation. 5151; 121: e266-e[AGE] advanced coronary atherosclerosis.

## 2016-08-27 NOTE — Telephone Encounter (Signed)
Received a fax today from [facility] Elita Quick) asking for missing information on this patient. The ICD10 codes R06.83- snoring ,R09.02- hypoxemia are not accepted by the insurance carrier. G47.33-OSA, G47.00-Insomnia are two codes selected from the list of codes sent.  Also patient demographics were asked for and are being sent.  Patient is not on oxygen or cpap. Paperwork has been given to Dr. Mayford Knife for signature. Waiting for paper work to return so it can be faxed to [facility].

## 2016-09-04 ENCOUNTER — Encounter: Payer: Self-pay | Admitting: Interventional Cardiology

## 2016-09-05 ENCOUNTER — Telehealth: Payer: Self-pay | Admitting: *Deleted

## 2016-09-05 NOTE — Telephone Encounter (Signed)
Per [facility] ([facility]) paper has been recieved

## 2016-09-05 NOTE — Telephone Encounter (Signed)
[facility] sent a fax for last office notes on 09/04/16 for GouldJill Seiler. I reached out to [facility] to let then know those notes were sent on 08/23/16. They checked and found the information I sent and apologized.  Late Entry: 09/04/16-[facility] sent back a form needing different ICD-10 codes and a signature from Dr. Katrinka BlazingSmith. [facility]

## 2016-09-10 ENCOUNTER — Other Ambulatory Visit: Payer: Self-pay | Admitting: Cardiology

## 2016-09-10 ENCOUNTER — Encounter: Payer: Self-pay | Admitting: Interventional Cardiology

## 2016-09-10 DIAGNOSIS — I712 Thoracic aortic aneurysm, without rupture, unspecified: Secondary | ICD-10-CM

## 2016-09-11 ENCOUNTER — Ambulatory Visit
Admission: RE | Admit: 2016-09-11 | Discharge: 2016-09-11 | Disposition: A | Discharge status: Home / Self Care | Payer: BC Managed Care – PPO | Source: Ambulatory Visit | Attending: Cardiology | Admitting: Cardiology

## 2016-09-11 ENCOUNTER — Encounter (HOSPITAL_BASED_OUTPATIENT_CLINIC_OR_DEPARTMENT_OTHER): Payer: BC Managed Care – PPO

## 2016-09-11 DIAGNOSIS — I712 Thoracic aortic aneurysm, without rupture, unspecified: Secondary | ICD-10-CM

## 2016-09-19 ENCOUNTER — Encounter (HOSPITAL_BASED_OUTPATIENT_CLINIC_OR_DEPARTMENT_OTHER): Payer: BC Managed Care – PPO

## 2016-09-20 ENCOUNTER — Encounter: Payer: Self-pay | Admitting: Cardiology

## 2016-09-20 NOTE — Telephone Encounter (Signed)
This encounter was created in error - please disregard.

## 2016-09-20 NOTE — Telephone Encounter (Signed)
Calls       Kathrene BongoJones, [place] P 3 hours ago (2:45 PM)     New message    Pt is returning call to [place].

## 2016-09-20 NOTE — Telephone Encounter (Deleted)
[facility]

## 2016-09-20 NOTE — Telephone Encounter (Signed)
Received a fax for order cancellation today from [facility] Patient Support Management for patient. Reason for cancellation:Patient declined use of benefits and self pay option. LMTCB

## 2016-09-20 NOTE — Telephone Encounter (Signed)
New message    Pt is returning call to [place].

## 2016-10-01 NOTE — Telephone Encounter (Signed)
Spoke to patient to follow up on the fax received from [facility] stating the patient declined the self pay option for her home sleep study.  Patient informed me that the self pay option for her sleep study was not affordable. Patient would like to know the cost of sleep study before being scheduled. Informed patient the sleep provider provides that information when we ask them on behalf of the patient. Patient states she has visited her PCP and that office has set her up with a sleep study at no cost to her.

## 2016-12-16 ENCOUNTER — Ambulatory Visit (INDEPENDENT_AMBULATORY_CARE_PROVIDER_SITE_OTHER): Payer: BC Managed Care – PPO | Admitting: Physician Assistant

## 2016-12-16 ENCOUNTER — Telehealth: Payer: Self-pay | Admitting: Interventional Cardiology

## 2016-12-16 ENCOUNTER — Encounter: Payer: Self-pay | Admitting: Physician Assistant

## 2016-12-16 VITALS — BP 140/84 | HR 62 | Ht 63.0 in | Wt 120.6 lb

## 2016-12-16 DIAGNOSIS — E784 Other hyperlipidemia: Secondary | ICD-10-CM

## 2016-12-16 DIAGNOSIS — I2510 Atherosclerotic heart disease of native coronary artery without angina pectoris: Secondary | ICD-10-CM | POA: Diagnosis not present

## 2016-12-16 DIAGNOSIS — I2584 Coronary atherosclerosis due to calcified coronary lesion: Secondary | ICD-10-CM | POA: Diagnosis not present

## 2016-12-16 DIAGNOSIS — I77810 Thoracic aortic ectasia: Secondary | ICD-10-CM | POA: Diagnosis not present

## 2016-12-16 DIAGNOSIS — I10 Essential (primary) hypertension: Secondary | ICD-10-CM

## 2016-12-16 DIAGNOSIS — R079 Chest pain, unspecified: Secondary | ICD-10-CM | POA: Insufficient documentation

## 2016-12-16 DIAGNOSIS — E7849 Other hyperlipidemia: Secondary | ICD-10-CM

## 2016-12-16 NOTE — Telephone Encounter (Signed)
Since Saturday pt felt like she was having heartburn.  C/o burning in her throat, neck and jaw pain and a fullness in her head.  Denies SOB, lightheadedness, dizziness, fatigue, visual disturbances or HA.  Has not taken any heartburn medications or Nitro.  States episode first occurred while she was eating egg whites and [place] bacon.  Pt had not checked vitals.  Still currently having sx.  Asked pt to check BP while on phone.  170/82, HR 69.  Pt states she went on her regular 3 mile run this morning with no issues.  Spoke with Dr. Katrinka BlazingSmith and he would like for pt to come in to be seen with an EKG.  Added pt to Jacolyn ReedyMichele Lenze, PA-C schedule at 11:15AM.  Pt verbalized understanding and was in agreement with this plan.

## 2016-12-16 NOTE — Telephone Encounter (Signed)
Carla [name] is calling because she has been having heartburn( not sure if its Chest Pains)  since Saturday and feel like since she has heart disease wants to come in today  to see Dr. Katrinka BlazingSmith . Please call

## 2016-12-16 NOTE — Progress Notes (Signed)
Cardiology Office Note    Date:  12/16/2016   ID:  Carla [name], DOB 1960/12/27, MRN [id number]  PCP:  Gweneth Dimitri, MD  Cardiologist:Dr. Katrinka Blazing     Chief Complaint  Patient presents with  . 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 Saw Dr. Katrinka Blazing 11//2017 as a second opinion. Has HTN requiring 3 drugs, ectasia of the aorta, coronary artery calcification on CT with nonobstructive CAD on cath 2016 , HLD, family history of CAD. She is very active running half marathons. She was concerned that her heart rate was faster than it used to be and is affecting her training. She also had dyspnea on exertion at moderate to high levels of physical activity. She also has a history of DVT that was treated with anticoagulation felt secondary to frequent plane flights. Pulmonary emboli were not diagnosis. History of syncope in the setting of alcohol ingestion, busy day at work and self adjustment of blood pressure medicines.  Dr. Katrinka Blazing felt dyspnea could be related to diastolic dysfunction and sleep apnea could also be considered. He recommended take control of her blood pressure given the enlarged aorta on CT scan 4 cm which is upper normal limits. and increase the beta blocker intensity to 50 mg daily. He decreased her Benicar to 20 mg daily. Sleep study performed 07/25/16 she had no significant obstructive sleep apnea but sleep efficiency was poor and the patient only slept 2 hours. She did have oxygen desaturation. Repeat sleep study with sleep aid recommended. Patient had repeat sleep study once again did not have adequate sleep time to assess for sleep apnea. Recommend home sleep study.  Patient was added onto my schedule today after calling and complaining of heartburn into  her throat neck and jaw and a fullness in her head. She said occurred while eating egg whites and [place] bacon. Saturday was having throat burning  while eating.Went to hot yoga and burning was present but didn't go away or get worse. Kayaked yesterday-no change in symptoms. Still had chest pain today and ran 3 miles without change in symptoms. Denies associated shortness of breath, increased heart rate, dizziness or presyncope. Patient lifted light weights Friday but nothing heavy and does not strain.  Past Medical History:  Diagnosis Date  . Aortic aneurysm (HCC)   . Arthralgia   . Carpal tunnel syndrome   . DVT (deep venous thrombosis) (HCC)   . Fibromyalgia   . HTN (hypertension)    [facility]  . Palpitation     Past Surgical History:  Procedure Laterality Date  . CARDIAC CATHETERIZATION N/A 09/15/2014   Procedure: Left Heart Cath and Coronary Angiography;  Surgeon: Kathleene Hazel, MD;  Location: [facility] INVASIVE CV LAB;  Service: Cardiovascular;  Laterality: N/A;    Current Medications: Current Meds  Medication Sig  . aspirin EC 81 MG tablet Take 81 mg by mouth daily.  . clonazePAM (KLONOPIN) 0.5 MG tablet Take 0.5 mg by mouth as needed. (for flying)  . Ergocalciferol (VITAMIN D2) 2000 units TABS Take 2,000 Units by mouth daily.  . hydrochlorothiazide (MICROZIDE) 12.5 MG capsule Take 12.5 mg by mouth daily.  . metoprolol succinate (TOPROL-XL) 50 MG 24 hr tablet Take 50 mg by mouth daily.  Marland Kitchen olmesartan (BENICAR) 20 MG tablet Take 1 tablet (20 mg total) by mouth daily.     Allergies:   Verapamil   Social History   Social History  .  Marital status: Married    Spouse name: N/A  . Number of children: N/A  . Years of education: N/A   Social History Main Topics  . Smoking status: Former Games developermoker  . Smokeless tobacco: Never Used     Comment: quit 2003-2004  . Alcohol use Yes  . Drug use: No  . Sexual activity: Not Asked   Other Topics Concern  . None   Social History Narrative  . None     Family History:  The patient's family history includes Aneurysm in her mother.   ROS:   Please see the history of present  illness.    Review of Systems  Constitution: Negative.  HENT: Negative.   Eyes: Negative.   Cardiovascular: Positive for chest pain.  Respiratory: Negative.   Hematologic/Lymphatic: Negative.   Musculoskeletal: Negative.  Negative for joint pain.  Gastrointestinal: Positive for heartburn.  Genitourinary: Negative.   Neurological: Negative.    All other systems reviewed and are negative.   PHYSICAL EXAM:   VS:  BP 140/84   Pulse 62   Ht 5\' 3"  (1.6 m)   Wt 120 lb 9.6 oz (54.7 kg)   LMP 10/31/2013   BMI 21.36 kg/m   Physical Exam  GEN: Thin, athletic, in no acute distress  Neck: no JVD, carotid bruits, or masses Cardiac:RRR; no murmurs, rubs, or gallops  Respiratory:  clear to auscultation bilaterally, normal work of breathing GI: soft, nontender, nondistended, + BS Ext: without cyanosis, clubbing, or edema, Good distal pulses bilaterally Neuro:  Alert and Oriented x 3 Psych: euthymic mood, full affect  Wt Readings from Last 3 Encounters:  12/16/16 120 lb 9.6 oz (54.7 kg)  08/11/16 129 lb (58.5 kg)  07/25/16 129 lb (58.5 kg)      Studies/Labs Reviewed:   EKG:  EKG is  ordered today.  The ekg ordered today demonstrates Normal sinus rhythm normal EKG  Recent Labs: No results found for requested labs within last 8760 hours.   Lipid Panel No results found for: CHOL, TRIG, HDL, CHOLHDL, VLDL, LDLCALC, LDLDIRECT  Additional studies/ records that were reviewed today include:  CT of chest 09/11/16  FINDINGS: Cardiovascular: Mild aortic calcifications are noted. The ascending aorta measures approximately 4 cm given the similar measurement technique that utilized on the prior exam. Tapering is noted within the thoracic aortic arch and descending thoracic aorta is within normal limits. No significant cardiac enlargement is noted. Mild coronary calcifications are seen.   Mediastinum/Nodes: The thoracic inlet is within normal limits. No significant hilar or mediastinal  adenopathy is noted.   Lungs/Pleura: Lungs are well aerated bilaterally with minimal scarring in the bases bilaterally. No focal infiltrate or sizable effusion is seen. No parenchymal nodules are noted.   Upper Abdomen: Visualized upper abdomen shows changes of prior cholecystectomy. No other focal abnormality is seen.   Musculoskeletal: Degenerative changes of the thoracic spine are noted. No acute bony abnormality is seen.   IMPRESSION: Ascending aorta now measures 4 cm given the similar measurement technique. Recommend annual imaging followup by CTA or MRA. This recommendation follows 2010 ACCF/AHA/AATS/ACR/ASA/SCA/SCAI/SIR/STS/SVM Guidelines for the Diagnosis and Management of Patients with Thoracic Aortic Disease. Circulation. 2010; 121: [id number]   No other focal abnormality is identified.   CT scan chest, 2017: IMPRESSION: 1. Ectatic ascending thoracic aorta, maximum diameter 3.8 cm, stable since 09/01/2014 using similar measurement technique. 2. Two vessel coronary atherosclerosis. 3. Mild centrilobular and minimal paraseptal emphysema.   Coronary angiography: 09/2014 Conclusion     Mid  RCA lesion, 10% stenosed.  Prox LAD lesion, 10% stenosed.  Mid LAD to Dist LAD lesion, 10% stenosed.   1. Mild non-obstructive coronary artery disease 2. Normal LV systolic function   Recommendations: Medical management of CAD. Follow up as planned with Dr. Donnie Aho.     June 2015: Stress echo Study Conclusions  Stress ECG conclusions:  The stress ECG was normal.  Impressions:  - Normal study after maximal exercise. Bruce protocol. Stress echocardiography.     ASSESSMENT:    1. Chest pain, unspecified type   2. Aortic ectasia, thoracic (HCC)   3. Essential hypertension   4. Coronary artery calcification of native artery   5. Other hyperlipidemia      PLAN:  In order of problems listed above:  Chest pain constant since eating breakfast Saturday morning no  change with exercise. Discussed in detail with Dr. Katrinka Blazing who spoke with patient and agrees that it most likely GI in origin. Recommend Maalox and over-the-counter Zantac for a few days. EKG completely normal. History of normal cardiac catheterization in 2016, coronary atherosclerosis on chest CT in 2017. Follow-up with Dr. Katrinka Blazing as scheduled.  Aortic ectasia with CT 09/2016 aorta measured 4 cm stable. Needs yearly CT scans  Essential hypertension controlled with Toprol and Benicar  Hyperlipidemia patient ran out of Lipitor have renewed prescription  Medication Adjustments/Labs and Tests Ordered: Current medicines are reviewed at length with the patient today.  Concerns regarding medicines are outlined above.  Medication changes, Labs and Tests ordered today are listed in the Patient Instructions below. Patient Instructions  Medication Instructions:  Your physician recommends that you continue on your current medications as directed. Please refer to the Current Medication list given to you today.   Labwork: None Ordered   Testing/Procedures: None Ordered   Follow-Up: Your physician recommends that you schedule a follow-up appointment in: keep appointment with Dr. Katrinka Blazing.   Any Other Special Instructions Will Be Listed Below (If Applicable).  Please take over the counter zantac as needed and malox.    If you need a refill on your cardiac medications before your next appointment, please call your pharmacy.      Signed, Jacolyn Reedy, PA-C  12/16/2016 1:16 PM    [facility] [address] [place], [place], [place]  [address] Phone: [phone]; Fax: [phone]

## 2016-12-16 NOTE — Patient Instructions (Signed)
Medication Instructions:  Your physician recommends that you continue on your current medications as directed. Please refer to the Current Medication list given to you today.   Labwork: None Ordered   Testing/Procedures: None Ordered   Follow-Up: Your physician recommends that you schedule a follow-up appointment in: keep appointment with Dr. Katrinka BlazingSmith.   Any Other Special Instructions Will Be Listed Below (If Applicable).  Please take over the counter zantac as needed and malox.    If you need a refill on your cardiac medications before your next appointment, please call your pharmacy.

## 2017-04-10 NOTE — Progress Notes (Signed)
Cardiology Office Note    Date:  04/11/2017   ID:  Carla RiegerJill [name], DOB [birth year], MRN [id number]  PCP:  Gweneth DimitriMcNeill, Wendy, MD  Cardiologist: Lesleigh NoeHenry W Smith III, MD   Chief Complaint  Patient presents with  . 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 Saw Dr. Katrinka BlazingSmith 11//2017 as a second opinion. Has HTN requiring 3 drugs, ectasia of the aorta, coronary artery calcification on CT with nonobstructive CAD on cath 2016 , HLD, family history of CAD.    Discomfort that she had in August resolved without consequences.  She has no cardiopulmonary complaints.  She is exercising without difficulty.  No medication side effects.  For many many questions concerning her medication regimen.  She is currently avoiding her primary care physician and has had no blood work done recently.  Past Medical History:  Diagnosis Date  . Aortic aneurysm (HCC)   . Arthralgia   . Carpal tunnel syndrome   . DVT (deep venous thrombosis) (HCC)   . Fibromyalgia   . HTN (hypertension)    [facility]  . Palpitation     Past Surgical History:  Procedure Laterality Date  . CARDIAC CATHETERIZATION N/A 09/15/2014   Procedure: Left Heart Cath and Coronary Angiography;  Surgeon: Kathleene Hazelhristopher D McAlhany, MD;  [facility];  Service: Cardiovascular;  Laterality: N/A;    Current Medications: Outpatient Medications Prior to Visit  Medication Sig Dispense Refill  . aspirin EC 81 MG tablet Take 81 mg by mouth daily.    Marland Kitchen. atorvastatin (LIPITOR) 20 MG tablet Take 20 mg by mouth daily.  1  . clonazePAM (KLONOPIN) 0.5 MG tablet Take 0.5 mg by mouth as needed. (for flying)    . Coenzyme Q10 (COQ10 PO) Take 1 capsule by mouth daily.    . Ergocalciferol (VITAMIN D2) 2000 units TABS Take 2,000 Units by mouth daily.    . hydrochlorothiazide (MICROZIDE) 12.5 MG capsule Take 12.5 mg by mouth daily.  1  . metoprolol succinate  (TOPROL-XL) 50 MG 24 hr tablet Take 50 mg by mouth daily.  3  . olmesartan (BENICAR) 20 MG tablet Take 1 tablet (20 mg total) by mouth daily. 90 tablet 3   No facility-administered medications prior to visit.      Allergies:   Verapamil   Social History   Socioeconomic History  . Marital status: Married    Spouse name: None  . Number of children: None  . Years of education: None  . Highest education level: None  Social Needs  . Financial resource strain: None  . Food insecurity - worry: None  . Food insecurity - inability: None  . Transportation needs - medical: None  . Transportation needs - non-medical: None  Occupational History  . None  Tobacco Use  . Smoking status: Former Games developermoker  . Smokeless tobacco: Never Used  . Tobacco comment: quit 2003-2004  Substance and Sexual Activity  . Alcohol use: Yes  . Drug use: No  . Sexual activity: None  Other Topics Concern  . None  Social History Narrative  . None     Family History:  The patient's family history includes Aneurysm in her mother.   ROS:   Please see the history of present illness.    No palpitations. All other systems reviewed and are negative.   PHYSICAL EXAM:   VS:  BP 124/72   Pulse 61  Ht 5\' 3"  (1.6 m)   Wt 121 lb 9.6 oz (55.2 kg)   LMP 10/31/2013   BMI 21.54 kg/m    GEN: Well nourished, well developed, in no acute distress  HEENT: normal  Neck: no JVD, carotid bruits, or masses Cardiac: RRR; no murmurs, rubs, or gallops,no edema  Respiratory:  clear to auscultation bilaterally, normal work of breathing GI: soft, nontender, nondistended, + BS MS: no deformity or atrophy  Skin: warm and dry, no rash Neuro:  Alert and Oriented x 3, Strength and sensation are intact Psych: euthymic mood, full affect  Wt Readings from Last 3 Encounters:  04/11/17 121 lb 9.6 oz (55.2 kg)  12/16/16 120 lb 9.6 oz (54.7 kg)  08/11/16 129 lb (58.5 kg)      Studies/Labs Reviewed:   EKG:  EKG not  performed.  Recent Labs: No results found for requested labs within last 8760 hours.   Lipid Panel No results found for: CHOL, TRIG, HDL, CHOLHDL, VLDL, LDLCALC, LDLDIRECT  Additional studies/ records that were reviewed today include:  None    ASSESSMENT:    1. Coronary artery disease involving native coronary artery of native heart without angina pectoris   2. Aortic ectasia, thoracic (HCC)   3. Essential hypertension   4. Other hyperlipidemia      PLAN:  In order of problems listed above:  1. Asymptomatic.Needs liver and lipid panel today. 2. 2 D doppler echocardiogram in 1 year prior to upcoming office visit. 3. Excellent control. Cmet today. 4. Lipid panel today. Target LDL < 70.   Clinal f/u 1 year.   Medication Adjustments/Labs and Tests Ordered: Current medicines are reviewed at length with the patient today.  Concerns regarding medicines are outlined above.  Medication changes, Labs and Tests ordered today are listed in the Patient Instructions below. There are no Patient Instructions on file for this visit.   Signed, Lesleigh NoeHenry W Smith III, MD  04/11/2017 10:11 AM    [facility] [address], [place], [place]  [address] Phone: [phone]) [phone]; Fax: [phone]) [phone]

## 2017-04-11 ENCOUNTER — Ambulatory Visit: Payer: BC Managed Care – PPO | Admitting: Interventional Cardiology

## 2017-04-11 ENCOUNTER — Encounter: Payer: Self-pay | Admitting: Interventional Cardiology

## 2017-04-11 VITALS — BP 124/72 | HR 61 | Ht 63.0 in | Wt 121.6 lb

## 2017-04-11 DIAGNOSIS — I10 Essential (primary) hypertension: Secondary | ICD-10-CM | POA: Diagnosis not present

## 2017-04-11 DIAGNOSIS — I77810 Thoracic aortic ectasia: Secondary | ICD-10-CM | POA: Diagnosis not present

## 2017-04-11 DIAGNOSIS — I2510 Atherosclerotic heart disease of native coronary artery without angina pectoris: Secondary | ICD-10-CM

## 2017-04-11 DIAGNOSIS — I7789 Other specified disorders of arteries and arterioles: Secondary | ICD-10-CM

## 2017-04-11 DIAGNOSIS — E7849 Other hyperlipidemia: Secondary | ICD-10-CM | POA: Diagnosis not present

## 2017-04-11 LAB — COMPREHENSIVE METABOLIC PANEL
ALK PHOS: 61 IU/L (ref 39–117)
ALT: 24 IU/L (ref 0–32)
AST: 23 IU/L (ref 0–40)
Albumin/Globulin Ratio: 2.9 — ABNORMAL HIGH (ref 1.2–2.2)
Albumin: 4.7 g/dL (ref 3.5–5.5)
BILIRUBIN TOTAL: 0.5 mg/dL (ref 0.0–1.2)
BUN/Creatinine Ratio: 24 — ABNORMAL HIGH (ref 9–23)
BUN: 17 mg/dL (ref 6–24)
CHLORIDE: 98 mmol/L (ref 96–106)
CO2: 27 mmol/L (ref 20–29)
Calcium: 9.3 mg/dL (ref 8.7–10.2)
Creatinine, Ser: 0.72 mg/dL (ref 0.57–1.00)
GFR calc non Af Amer: 93 mL/min/{1.73_m2} (ref >59)
GFR, EST AFRICAN AMERICAN: 108 mL/min/{1.73_m2} (ref >59)
GLUCOSE: 86 mg/dL (ref 65–99)
Globulin, Total: 1.6 g/dL (ref 1.5–4.5)
Potassium: 4 mmol/L (ref 3.5–5.2)
Sodium: 139 mmol/L (ref 134–144)
TOTAL PROTEIN: 6.3 g/dL (ref 6.0–8.5)

## 2017-04-11 LAB — LIPID PANEL
CHOL/HDL RATIO: 1.8 ratio (ref 0.0–4.4)
Cholesterol, Total: 127 mg/dL (ref 100–199)
HDL: 71 mg/dL (ref >39)
LDL CALC: 46 mg/dL (ref 0–99)
Triglycerides: 52 mg/dL (ref 0–149)
VLDL Cholesterol Cal: 10 mg/dL (ref 5–40)

## 2017-04-11 NOTE — Patient Instructions (Signed)
Medication Instructions:  Your physician recommends that you continue on your current medications as directed. Please refer to the Current Medication list given to you today.  Labwork: Lipid and CMET today  Testing/Procedures: Your physician has requested that you have an echocardiogram about a month prior to seeing Dr. Katrinka BlazingSmith back in a year. Echocardiography is a painless test that uses sound waves to create images of your heart. It provides your doctor with information about the size and shape of your heart and how well your heart's chambers and valves are working. This procedure takes approximately one hour. There are no restrictions for this procedure.    Follow-Up: Your physician wants you to follow-up in: 1 year with Dr. Katrinka BlazingSmith.  You will receive a reminder letter in the mail two months in advance. If you don't receive a letter, please call our office to schedule the follow-up appointment.   Any Other Special Instructions Will Be Listed Below (If Applicable).     If you need a refill on your cardiac medications before your next appointment, please call your pharmacy.

## 2017-04-24 ENCOUNTER — Encounter: Payer: Self-pay | Admitting: Interventional Cardiology

## 2017-04-24 ENCOUNTER — Other Ambulatory Visit: Payer: Self-pay | Admitting: Interventional Cardiology

## 2017-04-24 MED ORDER — ATORVASTATIN CALCIUM 20 MG PO TABS: 20.0000 mg | ORAL_TABLET | Freq: Every day | ORAL | 3 refills | Status: DC

## 2017-04-24 MED ORDER — HYDROCHLOROTHIAZIDE 12.5 MG PO CAPS: 12.5000 mg | ORAL_CAPSULE | Freq: Every day | ORAL | 3 refills | Status: DC

## 2017-06-07 ENCOUNTER — Encounter (HOSPITAL_COMMUNITY): Payer: Self-pay | Admitting: *Deleted

## 2017-06-07 ENCOUNTER — Other Ambulatory Visit: Payer: Self-pay

## 2017-06-07 ENCOUNTER — Ambulatory Visit (HOSPITAL_COMMUNITY)
Admission: EM | Admit: 2017-06-07 | Discharge: 2017-06-07 | Disposition: A | Discharge status: Home / Self Care | Payer: BC Managed Care – PPO

## 2017-06-07 DIAGNOSIS — R0782 Intercostal pain: Secondary | ICD-10-CM

## 2017-06-07 NOTE — ED Provider Notes (Signed)
  [facility]   [id number] 06/07/17 Arrival Time: 1159  ASSESSMENT & PLAN:  1. Intercostal pain     Take tylenol over the counter as directed. Discussed taking advil at home and wants to wait.    Reviewed expectations re: course of current medical issues. Questions answered. Outlined signs and symptoms indicating need for more acute intervention. Patient verbalized understanding. After Visit Summary given.   SUBJECTIVE: History from: patient. Carla RiegerJill Alfieri is a 57 y.o. female who presents with complaint of persistent right costal pain. Reports abrupt onset yesterday. Described symptoms have gradually worsened since beginning.  She c/o pain with movement and pain with application of finger on right lateral lower chest wall.  She has had her gb removed.  ROS: As per HPI.   OBJECTIVE:  Vitals:   06/07/17 1214  BP: (!) 146/75  Pulse: 71  Resp: 16  Temp: 98.2 F (36.8 C)  TempSrc: Oral  SpO2: 98%    General appearance: alert; no distress Eyes: PERRLA; EOMI; conjunctiva normal HENT: normocephalic; atraumatic; TMs normal; nasal mucosa normal; oral mucosa normal Neck: supple  Lungs: clear to auscultation bilaterally Heart: regular rate and rhythm Abdomen: soft, nontender; bowel sounds normal; no masses or organomegaly; no guarding or rebound tenderness Back: no CVA tenderness Extremities: no cyanosis or edema; symmetrical with no gross deformities MS - TTP right lateral lower intercostal. Skin: warm and dry Neurologic: normal gait; normal symmetric reflexes Psychological: alert and cooperative; normal mood and affect  Labs:  Labs Reviewed - No data to display  Imaging: No results found.  Allergies  Allergen Reactions  . Verapamil Cough    Past Medical History:  Diagnosis Date  . Aortic aneurysm (HCC)   . Arthralgia   . Carpal tunnel syndrome   . DVT (deep venous thrombosis) (HCC)   . Fibromyalgia   . HTN (hypertension)    [facility]  . Palpitation      Social History   Socioeconomic History  . Marital status: Married    Spouse name: Not on file  . Number of children: Not on file  . Years of education: Not on file  . Highest education level: Not on file  Social Needs  . Financial resource strain: Not on file  . Food insecurity - worry: Not on file  . Food insecurity - inability: Not on file  . Transportation needs - medical: Not on file  . Transportation needs - non-medical: Not on file  Occupational History  . Not on file  Tobacco Use  . Smoking status: Former Games developermoker  . Smokeless tobacco: Never Used  . Tobacco comment: quit 2003-2004  Substance and Sexual Activity  . Alcohol use: Yes  . Drug use: No  . Sexual activity: Not on file  Other Topics Concern  . Not on file  Social History Narrative  . Not on file   Family History  Problem Relation Age of Onset  . Aneurysm Mother        brain   Past Surgical History:  Procedure Laterality Date  . CARDIAC CATHETERIZATION N/A 09/15/2014   Procedure: Left Heart Cath and Coronary Angiography;  Surgeon: Kathleene Hazelhristopher D McAlhany, MD;  [facility];  Service: Cardiovascular;  Laterality: N/A;     Deatra Canterxford, William J, FNP 06/07/17 1243

## 2017-06-07 NOTE — ED Triage Notes (Signed)
Per pt she's been having pain in her right side. Per pt this happened Thursday and is still there.

## 2017-06-24 ENCOUNTER — Other Ambulatory Visit: Payer: Self-pay | Admitting: Gastroenterology

## 2017-06-24 DIAGNOSIS — R1011 Right upper quadrant pain: Secondary | ICD-10-CM

## 2017-07-02 ENCOUNTER — Ambulatory Visit
Admission: RE | Admit: 2017-07-02 | Discharge: 2017-07-02 | Disposition: A | Discharge status: Home / Self Care | Payer: BC Managed Care – PPO | Source: Ambulatory Visit | Attending: Gastroenterology | Admitting: Gastroenterology

## 2017-07-02 DIAGNOSIS — R1011 Right upper quadrant pain: Secondary | ICD-10-CM

## 2017-07-02 MED ORDER — IOPAMIDOL (ISOVUE-300) INJECTION 61%
100.0000 mL | Freq: Once | INTRAVENOUS | Status: AC | PRN
  Administered 2017-07-02: 100 mL via INTRAVENOUS

## 2018-02-10 ENCOUNTER — Telehealth: Payer: Self-pay | Admitting: Interventional Cardiology

## 2018-02-10 NOTE — Telephone Encounter (Signed)
New message:     Pt  Is calling and would like to schedule a echo f/u appt with Dr. Katrinka Blazing due to her moving back to [place] on 02/22/18 for good. She has an echo on 02/16/18.

## 2018-02-10 NOTE — Telephone Encounter (Signed)
Spoke with pt and scheduled her to see Dr. Katrinka Blazing 10/15

## 2018-02-15 NOTE — Progress Notes (Signed)
Cardiology Office Note:    Date:  02/17/2018   ID:  Carla [name], DOB Apr 12, 1961, MRN [id number]  PCP:  Gweneth Dimitri, MD  Cardiologist:  Lesleigh Noe, MD   Referring MD: Gweneth Dimitri, MD   Chief Complaint  Patient presents with  . Coronary Artery Disease    History of Present Illness:    Carla [name] is a 57 y.o. 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   She is doing okay.  She has a lot of questions concerning her underlying conditions.  She has bilateral jaw discomfort.  Discomfort can last all day.  Not associated with physical activity or chest pain.  Denies shortness of breath.  She does note occasional increase in heart rate greater than 160 bpm when she starts running and can also occur at other times.  This is short-lived.  There is no associated chest discomfort, lightheadedness, or dizziness.  Heart rates are monitored by a wearable device.  She is moving to [place].  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  She is a native [place].  Past Medical History:  Diagnosis Date  . Aortic aneurysm (HCC)   . Arthralgia   . Carpal tunnel syndrome   . DVT (deep venous thrombosis) (HCC)   . Fibromyalgia   . HTN (hypertension)    [facility]  . Palpitation     Past Surgical History:  Procedure Laterality Date  . CARDIAC CATHETERIZATION N/A 09/15/2014   Procedure: Left Heart Cath and Coronary Angiography;  Surgeon: Kathleene Hazel, MD;  Location: [facility] INVASIVE CV LAB;  Service: Cardiovascular;  Laterality: N/A;    Current Medications: Current Meds  Medication Sig  . aspirin EC 81 MG tablet Take 81 mg by mouth daily.  Marland Kitchen atorvastatin (LIPITOR) 20 MG tablet Take 1 tablet (20 mg total) by mouth daily.  . clonazePAM (KLONOPIN) 0.5 MG tablet Take 0.5 mg by mouth  as needed. (for flying)  . Coenzyme Q10 (COQ10 PO) Take 1 capsule by mouth daily.  . Ergocalciferol (VITAMIN D2) 2000 units TABS Take 2,000 Units by mouth daily.  . hydrochlorothiazide (MICROZIDE) 12.5 MG capsule Take 1 capsule (12.5 mg total) by mouth daily.  . metoprolol succinate (TOPROL-XL) 50 MG 24 hr tablet TAKE 1 TABLET BY MOUTH EVERY DAY WITH OR IMMEDIATELY FOLLOWING A MEAL  . olmesartan (BENICAR) 20 MG tablet TAKE 1 TABLET(20 MG) BY MOUTH DAILY     Allergies:   Verapamil   Social History   Socioeconomic History  . Marital status: Married    Spouse name: Not on file  . Number of children: Not on file  . Years of education: Not on file  . Highest education level: Not on file  Occupational History  . Not on file  Social Needs  . Financial resource strain: Not on file  . Food insecurity:    Worry: Not on file    Inability: Not on file  . Transportation needs:    Medical: Not on file    Non-medical: Not on file  Tobacco Use  . Smoking status: Former Games developer  . Smokeless tobacco: Never Used  . Tobacco comment: quit 2003-2004  Substance and Sexual Activity  . Alcohol use: Yes  . Drug use: No  . Sexual activity: Not on file  Lifestyle  . Physical activity:    Days per week: Not on file    Minutes per session: Not on file  . Stress: Not on file  Relationships  . Social connections:    Talks on phone: Not on file    Gets together: Not on file    Attends religious service: Not on file    Active member of club or organization: Not on file    Attends meetings of clubs or organizations: Not on file    Relationship status: Not on file  Other Topics Concern  . Not on file  Social History Narrative  . Not on file     Family History: The patient's family history includes Aneurysm in her mother.  ROS:   Please see the history of present illness.    She runs 3 to 5 miles 2 or 3 times per week.  She also does yoga and walks briskly several times per week.   Exercise/physical activity does not provoke chest discomfort or jaw discomfort.  All other systems reviewed and are negative.  EKGs/Labs/Other Studies Reviewed:    The following studies were reviewed today:  CT chest without contrast May 2018: IMPRESSION: Ascending aorta now measures 4 cm given the similar measurement technique. Recommend annual imaging followup by CTA or MRA. This recommendation follows 2010  EHCHOCARDIOGRAM 2019 ------------------------------------------------------------------- Study Conclusions   - Left ventricle: The cavity size was normal. There was mild   concentric hypertrophy. Systolic function was normal. The   estimated ejection fraction was in the range of 60% to 65%. Wall   motion was normal; there were no regional wall motion   abnormalities. Left ventricular diastolic function parameters   were normal. - Aortic valve: There was no regurgitation. - Mitral valve: There was mild regurgitation. - Left atrium: The atrium was normal in size. - Right ventricle: The cavity size was normal. Wall thickness was   normal. Systolic function was normal. - Tricuspid valve: There was mild regurgitation. - Pulmonary arteries: Systolic pressure was within the normal   range. - Pericardium, extracardiac: There was no pericardial effusion.   Impressions:   - Normal size of the aortic root and ascending aorta.   EKG:  EKG is  ordered today.  The ekg ordered today demonstrates sinus bradycardia, 56 bpm.  Poor R wave progression V1 through V3.  Other than bradycardia the tracing is essentially normal.  Recent Labs: 04/11/2017: ALT 24; BUN 17; Creatinine, Ser 0.72; Potassium 4.0; Sodium 139  Recent Lipid Panel    Component Value Date/Time   CHOL 127 04/11/2017 1024   TRIG 52 04/11/2017 1024   HDL 71 04/11/2017 1024   CHOLHDL 1.8 04/11/2017 1024   LDLCALC 46 04/11/2017 1024    Physical Exam:    VS:  BP 136/78   Pulse (!) 56   Ht 5' 3.5" (1.613 m)   Wt 123  lb 12.8 oz (56.2 kg)   LMP 10/31/2013   BMI 21.59 kg/m     Wt Readings from Last 3 Encounters:  02/17/18 123 lb 12.8 oz (56.2 kg)  04/11/17 121 lb 9.6 oz (55.2 kg)  12/16/16 120 lb 9.6 oz (54.7 kg)     GEN:  Well nourished, well developed in no acute distress HEENT: Normal NECK: No JVD. LYMPHATICS: No lymphadenopathy CARDIAC: RRR, no murmur, no gallop, no edema. VASCULAR: 2+ bilateral radial and carotid pulses.  No bruits. RESPIRATORY:  Clear to auscultation without rales, wheezing or rhonchi  ABDOMEN: Soft, non-tender, non-distended, No  pulsatile mass, MUSCULOSKELETAL: No deformity  SKIN: Warm and dry NEUROLOGIC:  Alert and oriented x 3 PSYCHIATRIC:  Normal affect   ASSESSMENT:    1. Coronary artery disease involving native coronary artery of native heart without angina pectoris   2. Essential hypertension   3. Other hyperlipidemia    PLAN:    In order of problems listed above:  1. Symptomatic coronary artery disease with coronary calcification.  Not sure if her coronary calcification carries a same application as the general population since she is very active and could have increased coronary calcium score as do many distance runners/marathoner's.  Not knowing if these syndromes are different, we are treating risk factors including blood pressure control, and lipids aiming for LDL less than 70. 2. Blood pressure target 130/80 mmHg or less.  We also using a beta-blocker because of aortic enlargement/dilatation. 3. LDL target less than 70.  She is on statin therapy. 4. Most recent measurement 4 cm.  Needs repeat screening perhaps with MR in 1 year.  The most recent echocardiogram did not have good windows for ascending aortic interrogation. 5. Some components of her history today raises the question of PSVT.  Continue beta-blocker therapy which is being used both for aortic dilatation, blood pressure, and possible suppression of PSVT.  This arrhythmias never been documented but  clinical features/history is somewhat suggestive.  Greater than 50% of the time was spent discussing risk factor modification and natural history of her underlying disease processes including coronary calcification, dilated aortic root, hypertension, and hyperlipidemia.   Medication Adjustments/Labs and Tests Ordered: Current medicines are reviewed at length with the patient today.  Concerns regarding medicines are outlined above.  Orders Placed This Encounter  Procedures  . EKG 12-Lead   No orders of the defined types were placed in this encounter.   Patient Instructions  Medication Instructions:  Your physician recommends that you continue on your current medications as directed. Please refer to the Current Medication list given to you today.  If you need a refill on your cardiac medications before your next appointment, please call your pharmacy.   Lab work: None If you have labs (blood work) drawn today and your tests are completely normal, you will receive your results only by: Marland Kitchen MyChart Message (if you have MyChart) OR . A paper copy in the mail If you have any lab test that is abnormal or we need to change your treatment, we will call you to review the results.  Testing/Procedures: None  Follow-Up: Please be sure to establish with a Cardiologist when you get to [place].  Any Other Special Instructions Will Be Listed Below (If Applicable).       Signed, Lesleigh Noe, MD  02/17/2018 5:40 PM    [facility]

## 2018-02-16 ENCOUNTER — Other Ambulatory Visit: Payer: Self-pay

## 2018-02-16 ENCOUNTER — Ambulatory Visit (HOSPITAL_COMMUNITY): Payer: BC Managed Care – PPO | Attending: Cardiology

## 2018-02-16 DIAGNOSIS — I7789 Other specified disorders of arteries and arterioles: Secondary | ICD-10-CM

## 2018-02-17 ENCOUNTER — Encounter: Payer: Self-pay | Admitting: Interventional Cardiology

## 2018-02-17 ENCOUNTER — Ambulatory Visit: Payer: BC Managed Care – PPO | Admitting: Interventional Cardiology

## 2018-02-17 VITALS — BP 136/78 | HR 56 | Ht 63.5 in | Wt 123.8 lb

## 2018-02-17 DIAGNOSIS — E7849 Other hyperlipidemia: Secondary | ICD-10-CM | POA: Diagnosis not present

## 2018-02-17 DIAGNOSIS — I2510 Atherosclerotic heart disease of native coronary artery without angina pectoris: Secondary | ICD-10-CM | POA: Diagnosis not present

## 2018-02-17 DIAGNOSIS — I77810 Thoracic aortic ectasia: Secondary | ICD-10-CM

## 2018-02-17 DIAGNOSIS — I10 Essential (primary) hypertension: Secondary | ICD-10-CM

## 2018-02-17 DIAGNOSIS — I479 Paroxysmal tachycardia, unspecified: Secondary | ICD-10-CM

## 2018-02-17 NOTE — Patient Instructions (Signed)
Medication Instructions:  Your physician recommends that you continue on your current medications as directed. Please refer to the Current Medication list given to you today.  If you need a refill on your cardiac medications before your next appointment, please call your pharmacy.   Lab work: None If you have labs (blood work) drawn today and your tests are completely normal, you will receive your results only by: Marland Kitchen MyChart Message (if you have MyChart) OR . A paper copy in the mail If you have any lab test that is abnormal or we need to change your treatment, we will call you to review the results.  Testing/Procedures: None  Follow-Up: Please be sure to establish with a Cardiologist when you get to [place].  Any Other Special Instructions Will Be Listed Below (If Applicable).

## 2018-02-19 ENCOUNTER — Other Ambulatory Visit: Payer: Self-pay | Admitting: Interventional Cardiology

## 2018-04-01 ENCOUNTER — Other Ambulatory Visit: Payer: Self-pay | Admitting: *Deleted

## 2018-04-01 MED ORDER — OLMESARTAN MEDOXOMIL 20 MG PO TABS: ORAL_TABLET | ORAL | 3 refills | Status: AC
